# Patient Record
Sex: Male | Born: 1985 | Race: White | Hispanic: No | Marital: Married | Smoking: Never smoker
Health system: Southern US, Community
[De-identification: ages and names within clinical notes are randomized; demographics above are authoritative.]

## PROBLEM LIST (undated history)

## (undated) DIAGNOSIS — T7840XA Allergy, unspecified, initial encounter: Secondary | ICD-10-CM

## (undated) DIAGNOSIS — N200 Calculus of kidney: Secondary | ICD-10-CM

## (undated) DIAGNOSIS — L409 Psoriasis, unspecified: Secondary | ICD-10-CM

## (undated) DIAGNOSIS — Z87442 Personal history of urinary calculi: Secondary | ICD-10-CM

## (undated) DIAGNOSIS — K219 Gastro-esophageal reflux disease without esophagitis: Secondary | ICD-10-CM

## (undated) HISTORY — PX: WISDOM TOOTH EXTRACTION: SHX21

## (undated) HISTORY — PX: PILONIDAL CYST EXCISION: SHX744

## (undated) HISTORY — DX: Allergy, unspecified, initial encounter: T78.40XA

## (undated) HISTORY — DX: Psoriasis, unspecified: L40.9

## (undated) HISTORY — PX: LITHOTRIPSY: SUR834

## (undated) HISTORY — PX: OTHER SURGICAL HISTORY: SHX169

## (undated) HISTORY — PX: UPPER GI ENDOSCOPY: SHX6162

## (undated) HISTORY — DX: Calculus of kidney: N20.0

---
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
## 2006-03-21 HISTORY — PX: PILONIDAL CYST EXCISION: SHX744

## 2012-08-21 ENCOUNTER — Ambulatory Visit (INDEPENDENT_AMBULATORY_CARE_PROVIDER_SITE_OTHER): Payer: BC Managed Care – PPO | Admitting: Family Medicine

## 2012-08-21 ENCOUNTER — Encounter: Payer: Self-pay | Admitting: Family Medicine

## 2012-08-21 VITALS — BP 120/80 | Temp 97.8°F | Ht 68.25 in | Wt 160.0 lb

## 2012-08-21 DIAGNOSIS — L408 Other psoriasis: Secondary | ICD-10-CM

## 2012-08-21 DIAGNOSIS — Z7689 Persons encountering health services in other specified circumstances: Secondary | ICD-10-CM

## 2012-08-21 DIAGNOSIS — L409 Psoriasis, unspecified: Secondary | ICD-10-CM

## 2012-08-21 DIAGNOSIS — Z7189 Other specified counseling: Secondary | ICD-10-CM

## 2012-08-21 MED ORDER — TRIAMCINOLONE ACETONIDE 0.1 % EX CREA: TOPICAL_CREAM | Freq: Two times a day (BID) | CUTANEOUS | Status: DC

## 2012-08-21 NOTE — Patient Instructions (Signed)
-  We have ordered labs or studies at this visit. It can take up to 1-2 weeks for results and processing. We will contact you with instructions IF your results are abnormal. Normal results will be released to your MYCHART. If you have not heard from us or can not find your results in MYCHART in 2 weeks please contact our office.  -PLEASE SIGN UP FOR MYCHART TODAY   We recommend the following healthy lifestyle measures: - eat a healthy diet consisting of lots of vegetables, fruits, beans, nuts, seeds, healthy meats such as white chicken and fish and whole grains.  - avoid fried foods, fast food, processed foods, sodas, red meet and other fattening foods.  - get a least 150 minutes of aerobic exercise per week.   Follow up in: 1 year or as needed  

## 2012-08-21 NOTE — Progress Notes (Signed)
Chief Complaint  Patient presents with  . Establish Care    HPI:  George Peterson is here to establish care. Recenlty moved back to area. Last PCP and physical: had physical in 2011.  Has the following chronic problems and concerns today:  Wants refill on steroid cream for skin issues -told was psoriasis in the past -currently not bad at all - a little pruritis of scalp and elbows  There are no active problems to display for this patient.  Health Maintenance: -UTD on vaccine, thinks had tdap in last 5 years  ROS: See pertinent positives and negatives per HPI.  Past Medical History  Diagnosis Date  . Allergy   . Kidney stones     multiple stones passed as child, had lithotripsy once  . Psoriasis     Family History  Problem Relation Age of Onset  . Arthritis Father   . Hypertension Mother     History   Social History  . Marital Status: Married    Spouse Name: N/A    Number of Children: N/A  . Years of Education: N/A   Social History Main Topics  . Smoking status: Never Smoker   . Smokeless tobacco: None  . Alcohol Use: Yes     Comment: occ, 2-4 drinks a few times per month  . Drug Use: No  . Sexually Active: None   Other Topics Concern  . 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  GENERAL: vitals reviewed and listed above, alert, oriented, appears well hydrated and in no acute distress  HEENT: atraumatic, conjunttiva clear, no obvious abnormalities on inspection of external nose and ears  NECK: no obvious masses on  inspection  LUNGS: clear to auscultation bilaterally, no wheezes, rales or rhonchi, good air movement  CV: HRRR, no peripheral edema  MS: moves all extremities without noticeable abnormality  SKIN: very minor dry skin on elbows, few flakes scalp - also very minor  PSYCH: pleasant and cooperative, no obvious depression or anxiety  ASSESSMENT AND PLAN:  Discussed the following assessment and plan:  Psoriasis - Plan: triamcinolone cream (KENALOG) 0.1 %  Encounter to establish care  -We reviewed the PMH, PSH, FH, SH, Meds and Allergies. -We provided refills for any medications we will prescribe as needed. -We addressed current concerns per orders and patient instructions. -We have asked for records for pertinent exams, studies, vaccines and notes from previous providers. -We have advised patient to follow up per instructions below.   -Patient advised to return or notify a doctor immediately if symptoms worsen or persist or new concerns arise.  Patient Instructions  -We have ordered labs or studies at this visit. It can take up to 1-2 weeks for results and processing. We will contact you with instructions IF your results are abnormal. Normal results will be released to your [facility]. If you have not heard from [place] or can not find your results  in [facility] in 2 weeks please contact our office.  -PLEASE SIGN UP FOR MYCHART TODAY   We recommend the following healthy lifestyle measures: - eat a healthy diet consisting of lots of vegetables, fruits, beans, nuts, seeds, healthy meats such as white chicken and fish and whole grains.  - avoid fried foods, fast food, processed foods, sodas, red meet and other fattening foods.  - get a least 150 minutes of aerobic exercise per week.   Follow up in: 1 year or as needed      Nicodemus Denk, Dahlia Client R.

## 2012-11-29 ENCOUNTER — Encounter: Payer: Self-pay | Admitting: Family Medicine

## 2012-11-29 ENCOUNTER — Ambulatory Visit (INDEPENDENT_AMBULATORY_CARE_PROVIDER_SITE_OTHER): Payer: BC Managed Care – PPO | Admitting: Family Medicine

## 2012-11-29 VITALS — BP 140/98 | Temp 98.1°F | Wt 161.0 lb

## 2012-11-29 DIAGNOSIS — H6120 Impacted cerumen, unspecified ear: Secondary | ICD-10-CM

## 2012-11-29 DIAGNOSIS — H60392 Other infective otitis externa, left ear: Secondary | ICD-10-CM

## 2012-11-29 DIAGNOSIS — H60399 Other infective otitis externa, unspecified ear: Secondary | ICD-10-CM

## 2012-11-29 DIAGNOSIS — H6122 Impacted cerumen, left ear: Secondary | ICD-10-CM

## 2012-11-29 MED ORDER — CIPROFLOXACIN-DEXAMETHASONE 0.3-0.1 % OT SUSP: 4.0000 [drp] | Freq: Two times a day (BID) | OTIC | Status: DC

## 2012-11-29 NOTE — Patient Instructions (Addendum)
-  use medication as instructed  -nothing else in ears  -return if persists or worsens

## 2012-11-29 NOTE — Progress Notes (Signed)
Chief Complaint  Patient presents with  . Otalgia    left    HPI:  Acute visit for:  Ear Pain: -started about 4 days ago -on and of pain and pressure in this ear -tried ear wax drops, had some wax come out and ear cleaning kits and water in ears -denies: fevers, chills, malaise, other symptoms, drainage from ears, hearing loss  ROS: See pertinent positives and negatives per HPI.  Past Medical History  Diagnosis Date  . Allergy   . Kidney stones     multiple stones passed as child, had lithotripsy once  . Psoriasis     Past Surgical History  Procedure Laterality Date  . Lithotripsy    . Lymph node biospy      with splenic infection  . Wisdom tooth extraction    . Pilonidal cyst excision      Family History  Problem Relation Age of Onset  . Arthritis Father   . Hypertension Mother     History   Social History  . Marital Status: Married    Spouse Name: N/A    Number of Children: N/A  . Years of Education: N/A   Social History Main Topics  . Smoking status: Never Smoker   . Smokeless tobacco: None  . Alcohol Use: Yes     Comment: occ, 2-4 drinks a few times per month  . Drug Use: No  . Sexual Activity: None   Other Topics Concern  . 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 For 7 days, Disp: 7.5 mL, Rfl: 0  EXAM:  Filed Vitals:   11/29/12 1338  BP: 140/98  Temp: 98.1 F (36.7 C)    Body mass index is 24.29 kg/(m^2).  GENERAL: vitals reviewed and listed above, alert, oriented, appears well hydrated and in no acute  distress  HEENT: atraumatic, conjunttiva clear, no obvious abnormalities on inspection of external nose and ears, left ear canal with significant ear wax - removed some soft wax and mild erythema of canal with mild swelling, some wax remained on TM; clear nasal congestion, PND, no pain with tragus pull, no mastoid TTP  NECK: no obvious masses on inspection  LUNGS: clear to auscultation bilaterally, no wheezes, rales or rhonchi, good air movement  CV: HRRR, no peripheral edema  MS: moves all extremities without noticeable abnormality  PSYCH: pleasant and cooperative, no obvious depression or anxiety  ASSESSMENT AND PLAN:  Discussed the following assessment and plan:  Otitis, externa, infective, left - Plan: ciprofloxacin-dexamethasone (CIPRODEX) otic suspension  Cerumen impaction, left  -removed wax from distal canal with soft current pt tolerated well -will tx what looks like mild otitis externa with ciprodex -pt is to return if symptoms persist or worsen -Patient advised to return or notify a doctor immediately if symptoms worsen or persist or new concerns arise.  Patient Instructions  -use medication as instructed  -nothing else in ears  -return if persists or worsens     Allix Blomquist,  Tawni Melkonian R.

## 2017-10-03 DIAGNOSIS — B070 Plantar wart: Secondary | ICD-10-CM | POA: Diagnosis not present

## 2017-10-03 DIAGNOSIS — L409 Psoriasis, unspecified: Secondary | ICD-10-CM | POA: Diagnosis not present

## 2018-01-14 DIAGNOSIS — Z0100 Encounter for examination of eyes and vision without abnormal findings: Secondary | ICD-10-CM | POA: Diagnosis not present

## 2018-04-03 DIAGNOSIS — L301 Dyshidrosis [pompholyx]: Secondary | ICD-10-CM | POA: Diagnosis not present

## 2018-04-03 DIAGNOSIS — L400 Psoriasis vulgaris: Secondary | ICD-10-CM | POA: Diagnosis not present

## 2018-04-20 ENCOUNTER — Telehealth: Payer: Self-pay | Admitting: Family

## 2018-04-20 DIAGNOSIS — R0602 Shortness of breath: Secondary | ICD-10-CM

## 2018-04-20 NOTE — Progress Notes (Signed)
Based on what you shared with me it looks like you have a serious condition that should be evaluated in a face to face office visit.  NOTE: If you entered your credit card information for this eVisit, you will not be charged. You may see a "hold" on your card for the $30 but that hold will drop off and you will not have a charge processed.  If you are having a true medical emergency please call 911.  If you need an urgent face to face visit, [facility] has four urgent care centers for your convenience.  If you need care fast and have a high deductible or no insurance consider:   [email or web address] to reserve your spot online an avoid wait times  [facility] [address], [address] [place], [place] [address] 8 am to 8 pm Monday-Friday 10 am to 4 pm Saturday-Sunday *Across the street from [employer]  [facility]  [address]  [place], [address] 8 am to 5 pm Monday-Friday * In the [facility]   The following sites will take your  insurance:  . [facility]  [phone] Get Driving Directions Find a Provider at this Location  [address] [place], [place] [address] . 10 am to 8 pm Monday-Friday . 12 pm to 8 pm Saturday-Sunday   . [facility]  [phone] Get Driving Directions Find a Provider at this Location  [address], [address] [place], [place] [address] . 8 am to 8 pm Monday-Friday . 9 am to 6 pm Saturday . 11 am to 6 pm Sunday   . [facility] [place]  [phone] Get Driving Directions  [address] [place],  [address] . 8 am to 8 pm Monday-Friday . 8 am to 4 pm Saturday-Sunday   Your e-visit answers were reviewed by a board certified advanced clinical practitioner to complete your personal care plan.  Thank you for using e-Visits.  

## 2018-04-23 ENCOUNTER — Ambulatory Visit: Payer: 59 | Admitting: Internal Medicine

## 2018-04-23 ENCOUNTER — Encounter: Payer: Self-pay | Admitting: Internal Medicine

## 2018-04-23 VITALS — BP 112/68 | HR 76 | Temp 98.1°F | Wt 157.9 lb

## 2018-04-23 DIAGNOSIS — J029 Acute pharyngitis, unspecified: Secondary | ICD-10-CM | POA: Diagnosis not present

## 2018-04-23 DIAGNOSIS — J069 Acute upper respiratory infection, unspecified: Secondary | ICD-10-CM

## 2018-04-23 NOTE — Patient Instructions (Signed)
Your exam is reassuring   And throat symptoms .     Are consistent with recovering  Viral flu like illness   Hot liquids  Ibuprofen   For comfort   Fu if  Relapsing sx .   High fevers.     Localized pain not going away. Of  Other concerns . May take another week to be totally better .

## 2018-04-23 NOTE — Progress Notes (Signed)
Chief Complaint  Patient presents with  . flu like symptoms    x1 week had a cough and runny nose. Friday  morning had low grade fever has had sore throat for about 3 days. Non productive cough. No chills or body aches.    HPI: George Peterson 33 y.o. come in for onset last tues  Jan 28 cold like and then fever for one day 99.9   Jan 31 and runny nose   Over weekend   Throat felt swollen right side    Wants throat  checked  Wife not sick but  Team at work last week had flu.  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  Past Medical History:  Diagnosis Date  . Allergy   . Kidney stones    multiple stones passed as child, had lithotripsy once  . Psoriasis     Family History  Problem Relation Age of Onset  . Arthritis Father   . Hypertension Mother     Social History   Socioeconomic History  . Marital status: Married    Spouse name: Not on file  . Number of children: Not on file  . Years of education: Not on file  . Highest education level: Not on file  Occupational History  . Not on file  Social Needs  . Financial resource strain: Not on file  . Food insecurity:    Worry: Not on file    Inability: Not on file  . Transportation needs:    Medical: Not on file    Non-medical: Not on file  Tobacco Use  . Smoking status: Never Smoker  . Smokeless tobacco: Never Used  Substance and Sexual Activity  . Alcohol use: Yes    Comment: occ, 2-4 drinks a few times per month  . Drug use: No  . Sexual activity: Not on file  Lifestyle  . Physical activity:    Days per week: Not on file    Minutes per session: Not on file  . Stress: Not on file  Relationships  . Social connections:    Talks on phone: Not on file    Gets together: Not on file    Attends religious service: Not on file    Active member of club or organization: Not on file    Attends meetings of clubs or organizations: Not on file   Relationship status: Not on file  Other Topics Concern  . 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 ciprofloxacin-dexamethasone (CIPRODEX) otic suspension Place 4 drops into the left ear 2 (two) times daily. For 7 days 7.5 mL 0  . triamcinolone cream (KENALOG) 0.1 % Apply topically 2 (two) times daily. 30 g 1   No facility-administered medications prior to visit.      EXAM:  BP 112/68 (BP Location: Right Arm, Patient Position: Sitting, Cuff Size: Normal)   Pulse 76   Temp 98.1 F (36.7 C) (Oral)   Wt 157 lb 14.4 oz (71.6 kg)   SpO2 98%   BMI  23.83 kg/m   Body mass index is 23.83 kg/m.  GENERAL: vitals reviewed and listed above, alert, oriented, appears well hydrated and in no acute distress mild congestion HEENT: atraumatic, conjunctiva  clear, no obvious abnormalities on inspection of external nose and ears tms clear OP : no lesion edema or exudate   Cobblestoning   Right more than left  Face not tender   NECK: no obvious masses on inspection palpation  Feels tender righ ac shaningbut no adenopathy  LUNGS: clear to auscultation bilaterally, no wheezes, rales or rhonchi, good air movement CV: HRRR, no clubbing cyanosis or  peripheral edema nl cap refill  MS: moves all extremities without noticeable focal  abnormality PSYCH: pleasant and cooperative, no obvious depression or anxiety No results found for: WBC, HGB, HCT, PLT, GLUCOSE, CHOL, TRIG, HDL, LDLDIRECT, LDLCALC, ALT, AST, NA, K, CL, CREATININE, BUN, CO2, TSH, PSA, INR, GLUF, HGBA1C, MICROALBUR BP Readings from Last 3 Encounters:  04/23/18 112/68  11/29/12 (!) 140/98  08/21/12 120/80    ASSESSMENT AND PLAN:  Discussed the following assessment and  plan:  Sore throat - viral cause  no obv bacterial infection  disc alarm sx and what to fu or alarm sx ex localizing sx and adenopathy   Acute upper respiratory infection of multiple sites flu like   -Patient advised to return or notify health care team  if  new concerns arise.  Patient Instructions  Your exam is reassuring   And throat symptoms .     Are consistent with recovering  Viral flu like illness   Hot liquids  Ibuprofen   For comfort   Fu if  Relapsing sx .   High fevers.     Localized pain not going away. Of  Other concerns . May take another week to be totally better .        Neta Mends. Jackey Housey M.D.

## 2018-09-07 ENCOUNTER — Ambulatory Visit (INDEPENDENT_AMBULATORY_CARE_PROVIDER_SITE_OTHER): Payer: 59 | Admitting: Family Medicine

## 2018-09-07 ENCOUNTER — Other Ambulatory Visit: Payer: Self-pay

## 2018-09-07 ENCOUNTER — Encounter: Payer: Self-pay | Admitting: Family Medicine

## 2018-09-07 DIAGNOSIS — L409 Psoriasis, unspecified: Secondary | ICD-10-CM | POA: Insufficient documentation

## 2018-09-07 DIAGNOSIS — Z20828 Contact with and (suspected) exposure to other viral communicable diseases: Secondary | ICD-10-CM

## 2018-09-07 DIAGNOSIS — Z20822 Contact with and (suspected) exposure to covid-19: Secondary | ICD-10-CM

## 2018-09-07 DIAGNOSIS — N200 Calculus of kidney: Secondary | ICD-10-CM

## 2018-09-07 NOTE — Progress Notes (Signed)
Virtual Visit via Video Note  I connected with. Name  on 09/07/18 at  9:30 AM EDT by a video enabled telemedicine application and verified that I am speaking with the correct person using two identifiers.  Location patient: home Location provider:work or home office Persons participating in the virtual visit: patient, provider  I discussed the limitations of evaluation and management by telemedicine and the availability of in person appointments. The patient expressed understanding and agreed to proceed.   George Peterson DOB: [birth year] Encounter date: 09/07/2018  This is a [age] y.o. male who presents with No chief complaint on file.   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 He states that building is open building with 5 foot cubed walls. There are over 400 people in building and right now just over 100 people in there. His biggest concern is that he is only one wearing mask at this point. Works for [employer].   Started working from home back in march.   Sees dermatology for psoriasis; occasionally gets steroid injections for flares when needed. Not on immunosuppressive therapy at this point. Generally well controlled between topical and the injectable steroids.   Has kidney stones about yearly. Usually able to pass on own. Has been awhile since he has seen someone for these. First episode when he was 33 yrs old. Oxylate based. Stays hydrated. Just manages on own. Hasn't seen someone (specialist) in about 10 years.   Hasn't had recent bloodwork.   No Known Allergies No outpatient medications have been marked as taking for the 09/07/18 encounter (Office Visit) with Wynn BankerKoberlein, Junell C, MD.    Review of Systems  Constitutional: Negative for chills, fatigue and fever.  Respiratory: Negative for cough, chest tightness, shortness of breath and wheezing.    Cardiovascular: Negative for chest pain, palpitations and leg swelling.  Skin:       Intermittent psoriasis flares; manages with topical steroid and occasionally steroid injection.    Objective:  There were no vitals taken for this visit.      BP Readings from Last 3 Encounters:  04/23/18 112/68  11/29/12 (!) 140/98  08/21/12 120/80   Wt Readings from Last 3 Encounters:  04/23/18 157 lb 14.4 oz (71.6 kg)  11/29/12 161 lb (73 kg)  08/21/12 160 lb (72.6 kg)    EXAM:  GENERAL: alert, oriented, appears well and in no acute distress  HEENT: atraumatic, conjunctiva clear, no obvious abnormalities on inspection of external nose and ears  NECK: normal movements of the head and neck  LUNGS: on inspection no signs of respiratory distress, breathing rate appears normal, no obvious gross SOB, gasping or wheezing  CV: no obvious cyanosis  MS: moves all visible extremities without noticeable abnormality  PSYCH/NEURO: pleasant and cooperative, no obvious depression or anxiety, speech and thought processing grossly intact   Assessment/Plan  1. Psoriasis Stable; follows with derm. Steroid cream/injection intermittently. Not on immunosuppressives due to limited flares.   2. Nephrolithiasis Recurrent. Self limited and works on dietary measures to avoid.   3. Exposure to Covid-19 Virus Most concerning to patient is wife's health. She has had multiple health issues in recent years. Has had issues with breathing/recurrent severe costochondritis and has undergone evaluation without solid diagnosis. Would like to limit her exposure and he worries that without colleagues masking at work and being in large building without rooms that exposure potential is  greater. He is masking and washing hands; taking precautions that he can. I have written work note for him to suggest that working remotely would be preferred. Even if we limited amount of time in the office and did more remote work; this  would be helpful from exposure standpoint.    Return has physical scheduled in August; will get bloodwork at that time..  Reviewed problem list, med history with him today.  I discussed the assessment and treatment plan with the patient. The patient was provided an opportunity to ask questions and all were answered. The patient agreed with the plan and demonstrated an understanding of the instructions.   The patient was advised to call back or seek an in-person evaluation if the symptoms worsen or if the condition fails to improve as anticipated.  I provided 22 minutes of non-face-to-face time during this encounter.   Micheline Rough, MD

## 2018-09-11 ENCOUNTER — Ambulatory Visit (INDEPENDENT_AMBULATORY_CARE_PROVIDER_SITE_OTHER): Payer: 59 | Admitting: Family Medicine

## 2018-09-11 ENCOUNTER — Ambulatory Visit: Payer: Self-pay | Admitting: *Deleted

## 2018-09-11 ENCOUNTER — Other Ambulatory Visit: Payer: Self-pay

## 2018-09-11 DIAGNOSIS — Z20828 Contact with and (suspected) exposure to other viral communicable diseases: Secondary | ICD-10-CM | POA: Diagnosis not present

## 2018-09-11 DIAGNOSIS — Z20822 Contact with and (suspected) exposure to covid-19: Secondary | ICD-10-CM

## 2018-09-11 NOTE — Telephone Encounter (Signed)
Pt called stating that he was exposed to a co-worker who tested positive for COVID; the affected person's last day at work was 09/06/2018; the pt says that he also needs to have a negative test result in order to accompany his wife to MD appointments; recommendations made per nurse triage protocol;he verbalized understanding; the pt normally sees Dr Presley Raddle, [facility]; pt transferred to [facility] for scheduling.  Reason for Disposition . [1] COVID-19 EXPOSURE (Close Contact) AND [2] within last 14 days BUT [3] NO symptoms  Answer Assessment - Initial Assessment Questions 1. CLOSE CONTACT: "Who is the person with the confirmed or suspected COVID-19 infection that you were exposed to?"   Co-worker 2. PLACE of CONTACT: "Where were you when you were exposed to COVID-19?" (e.g., home, school, medical waiting room; which city?)     work 3. TYPE of CONTACT: "How much contact was there?" (e.g., sitting next to, live in same house, work in same office, same building)     Same building 4. DURATION of CONTACT: "How long were you in contact with the COVID-19 patient?" (e.g., a few seconds, passed by person, a few minutes, live with the patient)     hours 5. DATE of CONTACT: "When did you have contact with a COVID-19 patient?" (e.g., how many days ago)    09/06/2018 6. TRAVEL: "Have you traveled out of the country recently?" If so, "When and where?"     * Also ask about out-of-state travel, since the CDC has identified some high-risk cities for community spread in the [place].     * Note: Travel becomes less relevant if there is widespread community transmission where the patient lives.     no 7. COMMUNITY SPREAD: "Are there lots of cases of COVID-19 (community spread) where you live?" (See public health department website, if unsure)       Major community spread 8. SYMPTOMS: "Do you have any symptoms?" (e.g., fever, cough, breathing difficulty)    no 9. PREGNANCY OR POSTPARTUM: "Is there any chance  you are pregnant?" "When was your last menstrual period?" "Did you deliver in the last 2 weeks?"     n/a 10. HIGH RISK: "Do you have any heart or lung problems? Do you have a weak immune system?" (e.g., CHF, COPD, asthma, HIV positive, chemotherapy, renal failure, diabetes mellitus, sickle cell anemia)      Psoriasis (last dose of kenalog Jan 2020); currently treats with topical agents  Protocols used: CORONAVIRUS (COVID-19) EXPOSURE-A-AH

## 2018-09-11 NOTE — Patient Instructions (Addendum)
I have asked my assistant to order Coronavirus (COVID19) testing for you. Please call our office if you have any concerns or questions or this testing has not been arranged in the next 24-48 hours.   If you develop symptoms please follow up with [place].  Wear a mask at all times if you are out and follow work recommendations from your health at work or [place]. The CDC is a good resource as well.

## 2018-09-11 NOTE — Progress Notes (Signed)
Virtual Visit via Video Note  I connected with George Peterson  on 09/11/18 at  4:20 PM EDT by a video enabled telemedicine application and verified that I am speaking with the correct person using two identifiers.  Location patient: home Location provider:work or home office Persons participating in the virtual visit: patient, provider  I discussed the limitations of evaluation and management by telemedicine and the availability of in person appointments. The patient expressed understanding and agreed to proceed.   HPI:  Possible COVID19 exposure: -someone in his building at work tested positive for COVID19 -he is not sure of who had it so he does not know if he was in any contact, but they did tell him that it was someone who works on a different floor so he does not believe he was in any contact with them -he does wear a mask at work -his wife needs him to get tested so that he she can see her obgyn -he is not having any symptoms -denies fevers, cough, sore throat , body aches, SOB -he does have a little runny nose and sneezing but that is normal or him with allergies  ROS: See pertinent positives and negatives per HPI.  Past Medical History:  Diagnosis Date  . Allergy   . Kidney stones    multiple stones passed as child, had lithotripsy once  . Psoriasis     Past Surgical History:  Procedure Laterality Date  . LITHOTRIPSY    . lymph node biospy     with splenic infection  . PILONIDAL CYST EXCISION  2008  . WISDOM TOOTH EXTRACTION      Family History  Problem Relation Age of Onset  . Arthritis Father   . Hypertension Mother     SOCIAL HX: see hpi   Current Outpatient Medications:  .  ciprofloxacin-dexamethasone (CIPRODEX) otic suspension, Place 4 drops into the left ear 2 (two) times daily. For 7 days, Disp: 7.5 mL, Rfl: 0 .  triamcinolone cream (KENALOG) 0.1 %, Apply topically 2 (two) times daily., Disp: 30 g, Rfl: 1  EXAM:  VITALS per patient if  applicable:  GENERAL: alert, oriented, appears well and in no acute distress  HEENT: atraumatic, conjunttiva clear, no obvious abnormalities on inspection of external nose and ears  NECK: normal movements of the head and neck  LUNGS: on inspection no signs of respiratory distress, breathing rate appears normal, no obvious gross SOB, gasping or wheezing  CV: no obvious cyanosis  MS: moves all visible extremities without noticeable abnormality  PSYCH/NEURO: pleasant and cooperative, no obvious depression or anxiety, speech and thought processing grossly intact  ASSESSMENT AND PLAN:  Discussed the following assessment and plan:  Exposure to Covid-19 Virus -  Because of the possible exposure, unknown contact did advise testing. His wife's ob/gyn office is  Requiring that he get tested before they will see her. Advised assistant to order. Follow up if any symptoms develop. Use mask, social distancing, good hand hygiene. Follow up as needed if positive or if symptoms develop or there are further questions.     I discussed the assessment and treatment plan with the patient. The patient was provided an opportunity to ask questions and all were answered. The patient agreed with the plan and demonstrated an understanding of the instructions.   The patient was advised to call back or seek an in-person evaluation if the symptoms worsen or if the condition fails to improve as anticipated.   Follow up instructions: Advised assistant Ronnald CollumJo Anne  to help patient arrange the following: -please order COVID19 testing  Lucretia Kern, DO

## 2018-09-11 NOTE — Telephone Encounter (Signed)
Pt has never been seen in this office, not anywhere in [facility] per chart. Pt will need NP visit to establish care before any testing can be ordered.

## 2018-09-12 ENCOUNTER — Telehealth: Payer: Self-pay | Admitting: *Deleted

## 2018-09-12 NOTE — Telephone Encounter (Signed)
-----   Message from Lucretia Kern, DO sent at 09/11/2018  4:40 PM EDT ----- -please order COVID19 testing

## 2018-09-12 NOTE — Telephone Encounter (Signed)
Community message sent to the [facility] for COVID test.  I called the pt and informed him someone will call with appt info.

## 2018-09-12 NOTE — Telephone Encounter (Signed)
Pt is calling and he was able to get covid 19 test at [facility]

## 2018-09-12 NOTE — Telephone Encounter (Signed)
Called pt, lvm to return call to schedule covid testing.  ° ° °

## 2018-09-13 NOTE — Telephone Encounter (Signed)
Spoke with patient and he has already had the test for COVID and appointment is not needed at this time.

## 2018-09-13 NOTE — Telephone Encounter (Signed)
Would E[facility] see pt for NP as virtual? Thanks!

## 2018-11-09 ENCOUNTER — Encounter: Payer: Self-pay | Admitting: Family Medicine

## 2018-11-09 ENCOUNTER — Ambulatory Visit: Payer: 59 | Admitting: Family Medicine

## 2018-11-09 ENCOUNTER — Other Ambulatory Visit: Payer: Self-pay

## 2018-11-09 VITALS — BP 110/80 | HR 97 | Temp 97.7°F | Ht 68.0 in | Wt 153.8 lb

## 2018-11-09 DIAGNOSIS — H6121 Impacted cerumen, right ear: Secondary | ICD-10-CM | POA: Diagnosis not present

## 2018-11-09 DIAGNOSIS — Z13220 Encounter for screening for lipoid disorders: Secondary | ICD-10-CM | POA: Diagnosis not present

## 2018-11-09 DIAGNOSIS — N200 Calculus of kidney: Secondary | ICD-10-CM

## 2018-11-09 DIAGNOSIS — Z0000 Encounter for general adult medical examination without abnormal findings: Secondary | ICD-10-CM | POA: Diagnosis not present

## 2018-11-09 DIAGNOSIS — Z23 Encounter for immunization: Secondary | ICD-10-CM | POA: Diagnosis not present

## 2018-11-09 DIAGNOSIS — R221 Localized swelling, mass and lump, neck: Secondary | ICD-10-CM | POA: Diagnosis not present

## 2018-11-09 LAB — CBC WITH DIFFERENTIAL/PLATELET
Basophils Absolute: 0.1 10*3/uL (ref 0.0–0.1)
Basophils Relative: 0.9 % (ref 0.0–3.0)
Eosinophils Absolute: 0.2 10*3/uL (ref 0.0–0.7)
Eosinophils Relative: 3.3 % (ref 0.0–5.0)
HCT: 41.8 % (ref 39.0–52.0)
Hemoglobin: 15 g/dL (ref 13.0–17.0)
Lymphocytes Relative: 28.7 % (ref 12.0–46.0)
Lymphs Abs: 1.9 10*3/uL (ref 0.7–4.0)
MCHC: 35.9 g/dL (ref 30.0–36.0)
MCV: 84.8 fl (ref 78.0–100.0)
Monocytes Absolute: 0.4 10*3/uL (ref 0.1–1.0)
Monocytes Relative: 6.4 % (ref 3.0–12.0)
Neutro Abs: 4.1 10*3/uL (ref 1.4–7.7)
Neutrophils Relative %: 60.7 % (ref 43.0–77.0)
Platelets: 177 10*3/uL (ref 150.0–400.0)
RBC: 4.93 Mil/uL (ref 4.22–5.81)
RDW: 12.2 % (ref 11.5–15.5)
WBC: 6.7 10*3/uL (ref 4.0–10.5)

## 2018-11-09 LAB — COMPREHENSIVE METABOLIC PANEL
ALT: 29 U/L (ref 0–53)
AST: 24 U/L (ref 0–37)
Albumin: 5.2 g/dL (ref 3.5–5.2)
Alkaline Phosphatase: 52 U/L (ref 39–117)
BUN: 8 mg/dL (ref 6–23)
CO2: 29 mEq/L (ref 19–32)
Calcium: 10 mg/dL (ref 8.4–10.5)
Chloride: 103 mEq/L (ref 96–112)
Creatinine, Ser: 0.91 mg/dL (ref 0.40–1.50)
GFR: 96.2 mL/min (ref >60.00)
Glucose, Bld: 90 mg/dL (ref 70–99)
Potassium: 4.2 mEq/L (ref 3.5–5.1)
Sodium: 140 mEq/L (ref 135–145)
Total Bilirubin: 0.8 mg/dL (ref 0.2–1.2)
Total Protein: 7.5 g/dL (ref 6.0–8.3)

## 2018-11-09 LAB — LIPID PANEL
Cholesterol: 144 mg/dL (ref 0–200)
HDL: 44.6 mg/dL (ref >39.00)
LDL Cholesterol: 85 mg/dL (ref 0–99)
NonHDL: 99.53
Total CHOL/HDL Ratio: 3
Triglycerides: 71 mg/dL (ref 0.0–149.0)
VLDL: 14.2 mg/dL (ref 0.0–40.0)

## 2018-11-09 LAB — TSH: TSH: 1.67 u[IU]/mL (ref 0.35–4.50)

## 2018-11-09 NOTE — Progress Notes (Signed)
George Peterson DOB: Aug 29, 1985 Encounter date: 11/09/2018  This is a 33 y.o. male who presents for complete physical   History of present illness/Additional concerns: -work was unwilling to let him work from home initially in order to protect wife; but then with orders coming out for masking - now everyone in building is required to wear a mask. Doesn't think there have been additional COVID cases.  No new health concerns.   Sometimes on right side of neck feels lump when he swallows. Cannot palpate anything there. 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 Not affecting his swallowing. But sometimes gets sensation of something being there/stuck. Has been going on for years. Just examine with palpation. Has been looked at by dentist. Comes and goes. Right now it is there. Cant tell exactly where it is in pathway of swallowing. Not worse since started. Hasn't associated anything with allergies, foods, reflux.   Had cholesterol screening done last year for work.   Eating healthy, exercising at home regularly - yoga, weights.   Is following with dermatology ([facility]) for psoriasis, but this has been pretty well controlled.   Past Medical History:  Diagnosis Date  . Allergy   . Kidney stones    multiple stones passed as child, had lithotripsy once  . Psoriasis    Past Surgical History:  Procedure Laterality Date  . LITHOTRIPSY    . lymph node biospy     with splenic infection  . PILONIDAL CYST EXCISION  2008  . WISDOM TOOTH EXTRACTION     No Known Allergies Current Meds  Medication Sig  . triamcinolone cream (KENALOG) 0.1 % Apply topically 2 (two) times daily.   Social History   Tobacco Use  . Smoking status: Never Smoker  . Smokeless tobacco: Never Used  Substance Use Topics  . Alcohol use: Yes    Comment: occ, 2-4 drinks a few times per month   Family History  Problem Relation Age of Onset  . Arthritis Father   . Hypertension Mother       Review of Systems  Constitutional: Negative for activity change, appetite change, chills, fatigue, fever and unexpected weight change.  HENT: Negative for congestion, ear pain, hearing loss, sinus pressure, sinus pain, sore throat and trouble swallowing.   Eyes: Negative for pain and visual disturbance.  Respiratory: Negative for cough, chest tightness, shortness of breath and wheezing.   Cardiovascular: Negative for chest pain, palpitations and leg swelling.  Gastrointestinal: Negative for abdominal distention, abdominal pain, blood in stool, constipation, diarrhea, nausea and vomiting.  Genitourinary: Negative for decreased urine volume, difficulty urinating, dysuria, penile pain and testicular pain.  Musculoskeletal: Negative for arthralgias, back pain and joint swelling.  Skin: Negative for rash.  Neurological: Negative for dizziness, weakness, numbness and headaches.  Hematological: Negative for adenopathy. Does not bruise/bleed easily.  Psychiatric/Behavioral: Negative for agitation, sleep disturbance and suicidal ideas. The patient is not nervous/anxious.     CBC: No results found for: WBC, HGB, HCT, MCH, MCHC, RDW, PLT, MPV CMP:No results found for: NA, K, CL, CO2, ANIONGAP, GLUCOSE, BUN, CREATININE, LABGLOB, GFRAA, CALCIUM, PROT, AGRATIO, BILITOT, ALKPHOS, ALT, AST, GLOB LIPID:No results found for: CHOL, TRIG, HDL, LDLCALC, LABVLDL  Objective:  BP 110/80 (BP Location: Right Arm, Patient Position: Sitting, Cuff Size: Normal)   Pulse 97   Temp 97.7 F (36.5 C) (Temporal)   Ht 5\' 8"  (1.727 m)   Wt 153 lb 12.8 oz (69.8 kg)   SpO2 97%  BMI 23.39 kg/m   Weight: 153 lb 12.8 oz (69.8 kg)   BP Readings from Last 3 Encounters:  11/09/18 110/80  04/23/18 112/68  11/29/12 (!) 140/98   Wt Readings from Last 3 Encounters:  11/09/18 153 lb 12.8 oz (69.8 kg)  04/23/18 157 lb 14.4 oz (71.6 kg)  11/29/12 161 lb (73 kg)    Physical Exam Constitutional:      General: He is  not in acute distress.    Appearance: He is well-developed.  HENT:     Head: Normocephalic and atraumatic.     Comments: Tolerated irrigation well. There was some bleeding in right ear canal from adhered cerumen. This resolved with pressure. No pain per patient. I did insert cotton ball with antibiotic ointment to right ear to wear for day.     Right Ear: External ear normal.     Left Ear: External ear normal.     Ears:     Comments: Right TM obstructed by cerumen.    Nose: Nose normal.     Mouth/Throat:     Pharynx: No oropharyngeal exudate.  Eyes:     Conjunctiva/sclera: Conjunctivae normal.     Pupils: Pupils are equal, round, and reactive to light.  Neck:     Musculoskeletal: Neck supple.     Thyroid: No thyromegaly.      Comments: There is superficial sensation of fullness (similar to cyst on palpation but not discrete borders) that will palpation does reproduce some of fullness sensation patient describes (although sx are subtle). Approximate 1.5cm area of fullness. No visible change, no skin color change, no movement with swallowing. Does not feel lymph node or thyroid associated. Very subtle on exam.  Cardiovascular:     Rate and Rhythm: Normal rate and regular rhythm.     Heart sounds: Normal heart sounds. No murmur. No friction rub. No gallop.   Pulmonary:     Effort: Pulmonary effort is normal. No respiratory distress.     Breath sounds: Normal breath sounds. No stridor. No wheezing or rales.  Abdominal:     General: Bowel sounds are normal.     Palpations: Abdomen is soft.     Hernia: There is no hernia in the left inguinal area or right inguinal area.  Genitourinary:    Scrotum/Testes: Normal.        Right: Mass, tenderness or swelling not present. Right testis is descended.        Left: Mass, tenderness or swelling not present. Left testis is descended.     Epididymis:     Right: Normal.     Left: Normal.  Musculoskeletal: Normal range of motion.  Lymphadenopathy:      Cervical: No cervical adenopathy.     Lower Body: No right inguinal adenopathy. No left inguinal adenopathy.  Skin:    General: Skin is warm and dry.  Neurological:     Mental Status: He is alert and oriented to person, place, and time.  Psychiatric:        Behavior: Behavior normal.        Thought Content: Thought content normal.        Judgment: Judgment normal.     Assessment/Plan: Health Maintenance Due  Topic Date Due  . HIV Screening  04/10/2001  . TETANUS/TDAP  04/10/2005  . INFLUENZA VACCINE  10/20/2018   Health Maintenance reviewed - Tdap given today.  1. Preventative health care Keep up with healthy eating and regular exercise.   2. Nephrolithiasis Controlled with  hydration.  - Comprehensive metabolic panel; Future  3. Sensation of lump in throat Very ill defined superficial edema noted on right side neck - will get US for further evaluation.  - CBC with Differential/Platelet; Future - TSH; Future - US Soft Tissue Head/Neck; Future  4. Lipid screening - Lipid panel; Future  5. Need for Tdap vaccination - Tdap vaccine greater than or equal to 7yo IM  6. Hearing loss due to cerumen impaction, right Relieved with irrigation in office today. Hearing improved post large cerumen impaction removal. Encouraged to call with any concerns after procedure. NO qtips or flushing of ear for a week in order to allow ear canal irritation to heal.  Return in about 1 year (around 11/09/2019) for physical exam.  Theodis ShoveJunell , MD

## 2018-11-09 NOTE — Patient Instructions (Signed)
It was nice to see you today! I will call with bloodwork results once I get them. You will get a call about scheduling ultrasound of neck in next couple of weeks. We will touch base with you again once we see those results.

## 2018-11-21 ENCOUNTER — Other Ambulatory Visit: Payer: Self-pay | Admitting: Emergency Medicine

## 2018-11-21 DIAGNOSIS — Z20822 Contact with and (suspected) exposure to covid-19: Secondary | ICD-10-CM

## 2018-11-22 LAB — NOVEL CORONAVIRUS, NAA: SARS-CoV-2, NAA: NOT DETECTED

## 2018-12-06 ENCOUNTER — Other Ambulatory Visit: Payer: Self-pay | Admitting: Cardiology

## 2018-12-06 ENCOUNTER — Other Ambulatory Visit: Payer: 59

## 2018-12-06 DIAGNOSIS — Z20822 Contact with and (suspected) exposure to covid-19: Secondary | ICD-10-CM

## 2018-12-08 LAB — NOVEL CORONAVIRUS, NAA: SARS-CoV-2, NAA: NOT DETECTED

## 2018-12-13 ENCOUNTER — Ambulatory Visit
Admission: RE | Admit: 2018-12-13 | Discharge: 2018-12-13 | Disposition: A | Discharge status: Home / Self Care | Payer: 59 | Source: Ambulatory Visit | Attending: Family Medicine | Admitting: Family Medicine

## 2018-12-13 DIAGNOSIS — R221 Localized swelling, mass and lump, neck: Secondary | ICD-10-CM

## 2018-12-19 ENCOUNTER — Other Ambulatory Visit: Payer: 59

## 2019-04-30 ENCOUNTER — Other Ambulatory Visit: Payer: Self-pay

## 2019-04-30 ENCOUNTER — Emergency Department (HOSPITAL_COMMUNITY): Payer: 59

## 2019-04-30 ENCOUNTER — Encounter (HOSPITAL_COMMUNITY): Payer: Self-pay | Admitting: Emergency Medicine

## 2019-04-30 ENCOUNTER — Emergency Department (HOSPITAL_COMMUNITY)
Admission: EM | Admit: 2019-04-30 | Discharge: 2019-04-30 | Disposition: A | Discharge status: Home / Self Care | Payer: 59 | Attending: Emergency Medicine | Admitting: Emergency Medicine

## 2019-04-30 DIAGNOSIS — R0602 Shortness of breath: Secondary | ICD-10-CM | POA: Diagnosis not present

## 2019-04-30 DIAGNOSIS — Z79899 Other long term (current) drug therapy: Secondary | ICD-10-CM | POA: Diagnosis not present

## 2019-04-30 DIAGNOSIS — Z20822 Contact with and (suspected) exposure to covid-19: Secondary | ICD-10-CM | POA: Insufficient documentation

## 2019-04-30 DIAGNOSIS — R0689 Other abnormalities of breathing: Secondary | ICD-10-CM

## 2019-04-30 LAB — COMPREHENSIVE METABOLIC PANEL
ALT: 22 U/L (ref 0–44)
AST: 18 U/L (ref 15–41)
Albumin: 4.8 g/dL (ref 3.5–5.0)
Alkaline Phosphatase: 47 U/L (ref 38–126)
Anion gap: 7 (ref 5–15)
BUN: 11 mg/dL (ref 6–20)
CO2: 30 mmol/L (ref 22–32)
Calcium: 9.6 mg/dL (ref 8.9–10.3)
Chloride: 103 mmol/L (ref 98–111)
Creatinine, Ser: 0.76 mg/dL (ref 0.61–1.24)
GFR calc Af Amer: >60 mL/min (ref >60)
GFR calc non Af Amer: >60 mL/min (ref >60)
Glucose, Bld: 98 mg/dL (ref 70–99)
Potassium: 3.8 mmol/L (ref 3.5–5.1)
Sodium: 140 mmol/L (ref 135–145)
Total Bilirubin: 1.2 mg/dL (ref 0.3–1.2)
Total Protein: 7.9 g/dL (ref 6.5–8.1)

## 2019-04-30 LAB — CBC WITH DIFFERENTIAL/PLATELET
Abs Immature Granulocytes: 0.02 10*3/uL (ref 0.00–0.07)
Basophils Absolute: 0.1 10*3/uL (ref 0.0–0.1)
Basophils Relative: 1 %
Eosinophils Absolute: 0.1 10*3/uL (ref 0.0–0.5)
Eosinophils Relative: 2 %
HCT: 44.8 % (ref 39.0–52.0)
Hemoglobin: 15.6 g/dL (ref 13.0–17.0)
Immature Granulocytes: 0 %
Lymphocytes Relative: 18 %
Lymphs Abs: 1.5 10*3/uL (ref 0.7–4.0)
MCH: 30.4 pg (ref 26.0–34.0)
MCHC: 34.8 g/dL (ref 30.0–36.0)
MCV: 87.3 fL (ref 80.0–100.0)
Monocytes Absolute: 0.6 10*3/uL (ref 0.1–1.0)
Monocytes Relative: 8 %
Neutro Abs: 5.9 10*3/uL (ref 1.7–7.7)
Neutrophils Relative %: 71 %
Platelets: 188 10*3/uL (ref 150–400)
RBC: 5.13 MIL/uL (ref 4.22–5.81)
RDW: 12.6 % (ref 11.5–15.5)
WBC: 8.2 10*3/uL (ref 4.0–10.5)
nRBC: 0 % (ref 0.0–0.2)

## 2019-04-30 LAB — TROPONIN I (HIGH SENSITIVITY)
Troponin I (High Sensitivity): <2 ng/L (ref <18)
Troponin I (High Sensitivity): <2 ng/L (ref <18)

## 2019-04-30 LAB — RESPIRATORY PANEL BY RT PCR (FLU A&B, COVID)
Influenza A by PCR: NEGATIVE
Influenza B by PCR: NEGATIVE
SARS Coronavirus 2 by RT PCR: NEGATIVE

## 2019-04-30 LAB — LIPASE, BLOOD: Lipase: 22 U/L (ref 11–51)

## 2019-04-30 LAB — D-DIMER, QUANTITATIVE: D-Dimer, Quant: 0.27 ug/mL-FEU (ref 0.00–0.50)

## 2019-04-30 NOTE — ED Notes (Addendum)
Patient maintaining at 100% o2 saturation on room air while ambulating.

## 2019-04-30 NOTE — ED Provider Notes (Signed)
[facility] DEPT Provider Note   CSN: [id number] Arrival date & time: 04/30/19  0830     History Chief Complaint  Patient presents with  . Shortness of Breath    George Peterson is a 34 y.o. male.  The history is provided by the patient and medical records. No language interpreter was used.  Shortness of Breath Severity:  Moderate Onset quality:  Sudden Duration:  4 days Timing:  Constant Progression:  Waxing and waning Context: not activity   Relieved by:  Nothing Worsened by:  Nothing Ineffective treatments:  None tried Associated symptoms: no abdominal pain, no chest pain, no cough, no diaphoresis, no fever, no headaches, no neck pain, no rash, no vomiting and no wheezing   Risk factors: no hx of cancer and no hx of PE/DVT        Past Medical History:  Diagnosis Date  . Allergy   . Kidney stones    multiple stones passed as child, had lithotripsy once  . Psoriasis     Patient Active Problem List   Diagnosis Date Noted  . Psoriasis 09/07/2018  . Nephrolithiasis 09/07/2018  . Exposure to COVID-19 virus 09/07/2018    Past Surgical History:  Procedure Laterality Date  . LITHOTRIPSY    . lymph node biospy     with splenic infection  . PILONIDAL CYST EXCISION  2008  . WISDOM TOOTH EXTRACTION         Family History  Problem Relation Age of Onset  . Arthritis Father   . Hypertension Mother     Social History   Tobacco Use  . Smoking status: Never Smoker  . Smokeless tobacco: Never Used  Substance Use Topics  . Alcohol use: Yes    Comment: occ, 2-4 drinks a few times per month  . Drug use: No    Home Medications Prior to Admission medications   Medication Sig Start Date End Date Taking? Authorizing Provider  ciprofloxacin-dexamethasone (CIPRODEX) otic suspension Place 4 drops into the left ear 2 (two) times daily. For 7 days Patient not taking: Reported on 11/09/2018 11/29/12   Lucretia Kern, DO  triamcinolone cream  (KENALOG) 0.1 % Apply topically 2 (two) times daily. 08/21/12   Lucretia Kern, DO    Allergies    Patient has no known allergies.  Review of Systems   Review of Systems  Constitutional: Negative for chills, diaphoresis, fatigue and fever.  HENT: Negative for congestion.   Eyes: Negative for visual disturbance.  Respiratory: Positive for chest tightness and shortness of breath. Negative for cough, wheezing and stridor.   Cardiovascular: Negative for chest pain, palpitations and leg swelling.  Gastrointestinal: Negative for abdominal pain, constipation, diarrhea, nausea and vomiting.  Genitourinary: Negative for dysuria, flank pain and frequency.  Musculoskeletal: Negative for back pain, neck pain and neck stiffness.  Skin: Negative for rash and wound.  Neurological: Negative for light-headedness and headaches.  Psychiatric/Behavioral: Negative for agitation and confusion.    Physical Exam Updated Vital Signs BP (!) 138/104 (BP Location: Left Arm)   Pulse (!) 110   Temp 98.1 F (36.7 C) (Oral)   Resp 18   SpO2 98%   Physical Exam Vitals and nursing note reviewed.  Constitutional:      General: He is not in acute distress.    Appearance: He is well-developed. He is not ill-appearing, toxic-appearing or diaphoretic.  HENT:     Head: Normocephalic and atraumatic.  Eyes:     Conjunctiva/sclera: Conjunctivae normal.  Pupils: Pupils are equal, round, and reactive to light.  Cardiovascular:     Rate and Rhythm: Regular rhythm. Tachycardia present.     Heart sounds: No murmur.  Pulmonary:     Effort: Pulmonary effort is normal. No tachypnea or respiratory distress.     Breath sounds: Normal breath sounds. No decreased breath sounds, wheezing, rhonchi or rales.  Chest:     Chest wall: No tenderness.  Abdominal:     Palpations: Abdomen is soft.     Tenderness: There is no abdominal tenderness.  Musculoskeletal:     Cervical back: Neck supple.     Right lower leg: No  tenderness. No edema.     Left lower leg: No tenderness. No edema.  Skin:    General: Skin is warm and dry.     Capillary Refill: Capillary refill takes less than 2 seconds.  Neurological:     General: No focal deficit present.     Mental Status: He is alert.  Psychiatric:        Mood and Affect: Mood normal.     ED Results / Procedures / Treatments   Labs (all labs ordered are listed, but only abnormal results are displayed) Labs Reviewed  RESPIRATORY PANEL BY RT PCR (FLU A&B, COVID)  CBC WITH DIFFERENTIAL/PLATELET  COMPREHENSIVE METABOLIC PANEL  LIPASE, BLOOD  D-DIMER, QUANTITATIVE (NOT AT [facility])  TROPONIN I (HIGH SENSITIVITY)  TROPONIN I (HIGH SENSITIVITY)    EKG EKG Interpretation  Date/Time:  Tuesday April 30 2019 08:50:36 EST Ventricular Rate:  86 PR Interval:    QRS Duration: 90 QT Interval:  333 QTC Calculation: 399 R Axis:   68 Text Interpretation: Sinus rhythm Borderline T wave abnormalities No prior ECG for comparison. T wave inversionin lead 3. No STEMI Confirmed by Theda Belfast ([id number]) on 04/30/2019 9:08:48 AM   Radiology DG Chest 2 View  Result Date: 04/30/2019 CLINICAL DATA:  Shortness of breath, no chest pain no cough. EXAM: CHEST - 2 VIEW COMPARISON:  None FINDINGS: Cardiomediastinal contours and hilar structures are unremarkable. Lungs are clear. Visualized skeletal structures are unremarkable. IMPRESSION: Normal chest Electronically Signed   By: Donzetta Kohut M.D.   On: 04/30/2019 09:26    Procedures Procedures (including critical care time)  Medications Ordered in ED Medications - No data to display  ED Course  I have reviewed the triage vital signs and the nursing notes.  Pertinent labs & imaging results that were available during my care of the patient were reviewed by me and considered in my medical decision making (see chart for details).    MDM Rules/Calculators/A&P                      George Peterson is a 34 y.o. male with a past  medical history significant for kidney stones who presents with difficulty breathing.  He reports that for the last few days, he has had times when he feels he cannot take a deep breath and his breath is catching.  He has never had this before.  He spoke to his PCP who told him come to the emergency department for evaluation.  He reports that a week ago he had a Covid exposure but has not been tested.  He denies fevers, chills, congestion, or cough.  He denies any actual chest pain or palpitations.  He denies history of DVT or PE and denies any leg pain or leg swelling.  He reports no recent long travel.  He denies  any nausea, vomiting, constipation, diarrhea, or urinary symptoms.  He is simply saying he is having some difficulty breathing where he cannot take a deep breath.  He reports it feels very abnormal for him.  He is otherwise healthy.  EKG on arrival shows T wave inversion in lead III.  No EKG present to compare.  Note STEMI.  On exam, lungs are clear and chest is nontender.  Abdomen is nontender.  Good pulses in all extremities.  Legs nontender nonedematous.  Patient resting comfortably but was tachycardic.  Patient will have work-up to rule out Covid, pneumonia, or other abnormality.  Will get D-dimer as he is tachycardic and not able to have the [facility] criteria rule him out for PE.  If work-up is reassuring, dissipate discharge home for outpatient PCP follow-up however we will make sure he does not have concerning etiology for symptoms.  1:56 PM Work-up returned and was overall reassuring.  Delta troponin was negative and undetectable.  D-dimer was negative for PE.  CBC and CMP reassuring.  Chest x-ray reassuring.  Covid test negative.  Covid and flu test negative.  Patient was very for approximately 5 hours with no significant abnormal vital signs and without further symptoms.  We feel he safe for discharge home.  Patient will follow with PCP, will try to stay hydrated and rest.  He agreed with  plan of care and return precautions.  Patient discharged in good condition.    Final Clinical Impression(s) / ED Diagnoses Final diagnoses:  Breathing difficulty    Rx / DC Orders ED Discharge Orders    None     Clinical Impression: 1. Breathing difficulty     Disposition: Discharge  Condition: Good  I have discussed the results, Dx and Tx plan with the pt(& family if present). He/she/they expressed understanding and agree(s) with the plan. Discharge instructions discussed at great length. Strict return precautions discussed and pt &/or family have verbalized understanding of the instructions. No further questions at time of discharge.    New Prescriptions   No medications on file    Follow Up: Wynn Banker, MD [address] [place] [address] [phone]     [facility] [address] [id number] [address]c [place] [address] [phone]       Jereld Presti, Canary Brim, MD 04/30/19 1357

## 2019-04-30 NOTE — Discharge Instructions (Signed)
Your work-up today was completely reassuring.  We did not see any evidence of acute cardiac abnormality with the lab work and your chest x-ray is reassuring.  Your coronavirus test was negative and your test to rule out blood clot showed no blood clot.  Your vital signs were reassuring for the duration of your emergency department stay and thus we feel you are safe for discharge home to follow-up with a primary doctor.  Please rest and stay hydrated.  If any symptoms change or worsen, please return to nearest emergency department.

## 2019-04-30 NOTE — ED Triage Notes (Signed)
Patient called his primary care with a C/C of intermittent SOB and they told him to come to the ED. Patient alert, oriented and ambulatory upon arrival, denies and chest pain, cough, sputum production, but does endorses SOB. Said he was exposed to COVID, someone at work tested postive 2 weeks ago.

## 2019-12-02 ENCOUNTER — Encounter: Payer: Self-pay | Admitting: Family Medicine

## 2019-12-02 ENCOUNTER — Other Ambulatory Visit: Payer: Self-pay

## 2019-12-02 ENCOUNTER — Ambulatory Visit: Payer: 59 | Admitting: Family Medicine

## 2019-12-02 VITALS — BP 110/82 | HR 76 | Temp 98.7°F | Ht 68.0 in | Wt 157.5 lb

## 2019-12-02 DIAGNOSIS — N200 Calculus of kidney: Secondary | ICD-10-CM

## 2019-12-02 DIAGNOSIS — R319 Hematuria, unspecified: Secondary | ICD-10-CM

## 2019-12-02 LAB — POC URINALSYSI DIPSTICK (AUTOMATED)
Bilirubin, UA: NEGATIVE
Glucose, UA: NEGATIVE
Ketones, UA: NEGATIVE
Leukocytes, UA: NEGATIVE
Nitrite, UA: NEGATIVE
Protein, UA: NEGATIVE
Spec Grav, UA: 1.01 (ref 1.010–1.025)
Urobilinogen, UA: 0.2 E.U./dL
pH, UA: 7 (ref 5.0–8.0)

## 2019-12-02 NOTE — Progress Notes (Signed)
George Peterson DOB: [birth year] Encounter date: 12/02/2019  This is a 34 y.o. male who presents with Chief Complaint  Patient presents with  . Hematuria    large amount x1 week  . Back Pain    low back pain x1 week, history of kidney stones and states this feels different in which he now notices aching, seen at an urgent care 3 days ago, no infection noted and given Rx for Tamsulosin    History of present illness: A week and a half ago started to notice blood in urine, which wasn't immediately concerning because he is used to kidney stones. What worried him this time was amount of blood. Usually more discoloration but this time was very red, cloudy. Lower back pain was different than the stone pain - this was more ache; usually stone pain is more stabbing. Coloration improved with water intake. Friday went to urgent care and was told there was blood, but no infection. Given rx for flomax which he got on Saturday and took this Saturday, Sunday. Still with pain. No drastic change. One side effect is that yesterday evening had retrograde ejaculation.   No blood in semen than he noted.   One day had some suprapubic abd pain. Just there a few hours and then gone. Was driving on Friday/monday prior to start of sx.   No fevers. Bowels working ok.   Current pain is on left. Did notice on right one day.   No new trigger that he would have thought cause pain or increase in kidney stone formation.   No Known Allergies Current Meds  Medication Sig  . acetaminophen (TYLENOL) 500 MG tablet Take 1,000 mg by mouth every 6 (six) hours as needed for headache.  . betamethasone dipropionate 0.05 % cream Apply 1 application topically 2 (two) times daily.  . DUOBRII 0.01-0.045 % LOTN Apply 1 application topically daily.  . finasteride (PROSCAR) 5 MG tablet Take 5 mg by mouth daily.  . fluticasone (CUTIVATE) 0.05 % cream Apply 1 application topically 2 (two) times daily. Apply to face or skin fold areas use  1 week on/off as needed.  . Multiple Vitamin (MULTIVITAMIN WITH MINERALS) TABS tablet Take 1 tablet by mouth daily.  . tamsulosin (FLOMAX) 0.4 MG CAPS capsule Take 0.4 mg by mouth daily.  Marland Kitchen triamcinolone cream (KENALOG) 0.1 % Apply topically 2 (two) times daily. (Patient taking differently: Apply 1 application topically 2 (two) times daily as needed (For psoriasis.). )    Review of Systems  Constitutional: Negative for chills, fatigue and fever.  Respiratory: Negative for cough, chest tightness, shortness of breath and wheezing.   Cardiovascular: Negative for chest pain, palpitations and leg swelling.  Genitourinary: Positive for flank pain and hematuria. Negative for difficulty urinating, dysuria, frequency, penile pain, penile swelling, testicular pain and urgency.    Objective:  BP 110/82 (BP Location: Left Arm, Patient Position: Sitting, Cuff Size: Normal)   Pulse 76   Temp 98.7 F (37.1 C) (Oral)   Ht 5\' 8"  (1.727 m)   Wt 157 lb 8 oz (71.4 kg)   BMI 23.95 kg/m   Weight: 157 lb 8 oz (71.4 kg)   BP Readings from Last 3 Encounters:  12/02/19 110/82  04/30/19 130/89  11/09/18 110/80   Wt Readings from Last 3 Encounters:  12/02/19 157 lb 8 oz (71.4 kg)  11/09/18 153 lb 12.8 oz (69.8 kg)  04/23/18 157 lb 14.4 oz (71.6 kg)    Physical Exam Constitutional:  General: He is not in acute distress.    Appearance: He is well-developed.  Cardiovascular:     Rate and Rhythm: Normal rate and regular rhythm.     Heart sounds: Normal heart sounds. No murmur heard.  No friction rub.  Pulmonary:     Effort: Pulmonary effort is normal. No respiratory distress.     Breath sounds: Normal breath sounds. No wheezing or rales.  Abdominal:     General: Abdomen is flat. Bowel sounds are normal. There is no distension.     Tenderness: There is no abdominal tenderness. There is no right CVA tenderness, left CVA tenderness, guarding or rebound.  Musculoskeletal:     Right lower leg: No  edema.     Left lower leg: No edema.  Neurological:     Mental Status: He is alert and oriented to person, place, and time.  Psychiatric:        Behavior: Behavior normal.     Assessment/Plan  1. Nephrolithiasis Suspect kidney stone. He is much more comfortable today than over the weekend. We will recheck urine today here in office. Continue with flomax. I am going to check in with him once I get urine culture results. I have asked him to notify me if increase in blood. We discussed that if not resolving or if pain not improving we can consider either urology referral or CT stone analysis.   2. Hematuria, unspecified type See above.  - Urine Culture; Future - Urinalysis; Future - POCT Urinalysis Dipstick (Automated) - Urinalysis - Urine Culture    Return for pending update when we check in.  Time for visit, discussion of current symptoms and possible evaluation options, exam, charting 30 minutes.    Theodis Shove, MD

## 2019-12-03 LAB — URINE CULTURE
MICRO NUMBER:: 10941991
Result:: NO GROWTH
SPECIMEN QUALITY:: ADEQUATE

## 2019-12-03 LAB — URINALYSIS
Bilirubin Urine: NEGATIVE
Glucose, UA: NEGATIVE
Ketones, ur: NEGATIVE
Leukocytes,Ua: NEGATIVE
Nitrite: NEGATIVE
Protein, ur: NEGATIVE
Specific Gravity, Urine: 1.004 (ref 1.001–1.03)
pH: 7 (ref 5.0–8.0)

## 2019-12-04 ENCOUNTER — Ambulatory Visit: Payer: 59 | Admitting: Internal Medicine

## 2019-12-04 NOTE — Addendum Note (Signed)
Addended by: Johnella Moloney on: 12/04/2019 11:29 AM   Modules accepted: Orders

## 2019-12-06 ENCOUNTER — Other Ambulatory Visit: Payer: Self-pay | Admitting: Urology

## 2019-12-09 ENCOUNTER — Other Ambulatory Visit (HOSPITAL_COMMUNITY)
Admission: RE | Admit: 2019-12-09 | Discharge: 2019-12-09 | Disposition: A | Discharge status: Home / Self Care | Payer: 59 | Source: Ambulatory Visit | Attending: Urology | Admitting: Urology

## 2019-12-09 DIAGNOSIS — Z20822 Contact with and (suspected) exposure to covid-19: Secondary | ICD-10-CM | POA: Insufficient documentation

## 2019-12-09 DIAGNOSIS — Z01812 Encounter for preprocedural laboratory examination: Secondary | ICD-10-CM | POA: Insufficient documentation

## 2019-12-09 LAB — SARS CORONAVIRUS 2 (TAT 6-24 HRS): SARS Coronavirus 2: NEGATIVE

## 2019-12-10 NOTE — Progress Notes (Signed)
Talked with patient, Instructions given. Clear liquids until 700 am. Arrival time 0915. Driver secured.

## 2019-12-11 NOTE — H&P (Signed)
: 10/17/2019: Patient with past medical history significant for nephrolithiasis he typically has exacerbations related to obstructive stone disease 1-2 times per year. He has previously underwent lithotripsy as well as ureteroscopy for stone treatment. Seen last winter for exacerbation of right-sided renal colic and diagnosed with a 3 mm mildly obstructing distal ureteral calculi. On follow-up KUB imaging did not show an obvious ureteral calculi on that side and patient symptoms had improved. He has not followed up since then. He was evaluated in late March in the [facility] system for right sided renal colic. CT imaging performed which showed small bilateral non-obstructing calculi as well as an obstructing 4.26mm distal right ureteral calculi. I am only able to view imaging report in regards to this.   Presents today for concerns of a possible obstructing ureteral calculus.   Historically his stones on previous analysis have been calcium oxalate.   Symptoms began this morning around 6:00 a.m.Marland Kitchen Complaining of pain/discomfort in the left lower back and flank area. No radiation to the abdomen or groin. At the onset of symptoms he took tamsulosin which has helped ease pain a little bit by his report. Not associated with increased frequency or urgency, burning or painful urination, changes in force of stream. He denies fevers or chills, nausea/vomiting   -11/20/19-patient with history of recent left lower back and flank pain. Seen by Anne Fu and treated as presumptive ureteral calculus. KUB at that time showed some renal calculi but no obvious ureteral stone. Patient has been on tamsulosin 0.4 mg daily. Currently patient is asymptomatic. Had ultrasound earlier today which showed some nonobstructing bilateral renal calculi. No evidence of hydronephrosis. KUB is reviewed today and shows calcification previously seen overlying left renal contour on 10/17/2019 now has migrated into the region of the left distal  ureter the region of L3 consistent with upper ureteral calculus. This measures 4-5 mm in size  Urinalysis today shows 10-20 RBCs  -12/05/19-patient with history of left upper ureteral calculus as above. Has been on tamsulosin as medical expulsive therapy. Here for follow-up KUB. The patient over the last week has actually developed some right-sided flank pain radiating around to the right inguinal area. He has had no nausea vomiting or fever. He has had no left-sided pain.  KUB is reviewed today shows persistent calcification in the region of L3 on the left. I see no obvious calcifications to suggest right ureteral stone. Micro urinalysis today is clear  CT urogram was subsequently obtained today and this is reviewed. By initial review this shows an approximate 5-6 mm partially obstructing stone in the region of the left upper ureter at around L3. This is outside the renal shadow. Review of the right side shows a nonobstructing 4-5 mm flat calculus but no evidence of ureteral stone or hydronephrosis on the right. Over read is pending     ALLERGIES: No Allergies    MEDICATIONS: Tamsulosin Hcl 0.4 mg capsule 1 capsule PO Daily  Atorvastatin Calcium  Hydrocodone-Acetaminophen 5 mg-325 mg tablet 1 tablet PO Q 6 H PRN  Lisinopril 10 mg tablet Oral     GU PSH: Cysto Uretero Lithotripsy - 2011 ESWL - 2015, 2015, 2008, 2008 Ureteroscopic stone removal - 2016, 2008       PSH Notes: Cystoscopy With Ureteroscopy With Removal Of Calculus, Lithotripsy, Lithotripsy, Cystoscopy With Ureteroscopy With Lithotripsy, Cystoscopy With Ureteroscopy With Manipulation Of Calculus, Lithotripsy, Lithotripsy   NON-GU PSH: None   GU PMH: Ureteral calculus (Stable) - 11/20/2019, - 2020, Calculus of ureter, -  2016, Calculus of right ureter, - 2014 Renal colic - 2020, - 2020, Renal colic, - 2017 Renal calculus - 2019, (Stable), - 2018, Nephrolithiasis, - 2017 Flank Pain - 2018, Generalized abdominal pain, - 2014 Renal  and ureteral calculus - 2017 Abdominal Pain Unspec, Left flank pain - 2016    NON-GU PMH: Encounter for general adult medical examination without abnormal findings, Encounter for preventive health examination - 2017 Personal history of other diseases of the circulatory system, History of hypertension - 2016 Cardiac murmur, unspecified, Murmurs - 2014    FAMILY HISTORY: Alzheimer's Disease - Runs In Family Cardiac Failure - Runs In Family Family Health Status Number - Runs In Family Kidney Cancer - Runs In Family skin cancer - Father Urologic Disorder - Runs In Family   SOCIAL HISTORY: Marital Status: Married Preferred Language: English; Ethnicity: Not Hispanic Or Latino; Race: White     Notes: Never A Smoker, Occupation:, Tobacco Use, Marital History - Currently Married, Caffeine Use, Alcohol Use   REVIEW OF SYSTEMS:    GU Review Male:   Patient denies frequent urination, hard to postpone urination, burning/ pain with urination, get up at night to urinate, leakage of urine, stream starts and stops, trouble starting your stream, have to strain to urinate , erection problems, and penile pain.  Gastrointestinal (Upper):   Patient denies nausea, vomiting, and indigestion/ heartburn.  Gastrointestinal (Lower):   Patient denies diarrhea and constipation.  Constitutional:   Patient denies fever, night sweats, weight loss, and fatigue.  Skin:   Patient denies skin rash/ lesion and itching.  Eyes:   Patient denies blurred vision and double vision.  Ears/ Nose/ Throat:   Patient denies sore throat and sinus problems.  Hematologic/Lymphatic:   Patient denies swollen glands and easy bruising.  Cardiovascular:   Patient denies leg swelling and chest pains.  Respiratory:   Patient denies shortness of breath and cough.  Endocrine:   Patient denies excessive thirst.  Musculoskeletal:   Patient reports back pain. Patient denies joint pain.  Neurological:   Patient denies headaches and dizziness.   Psychologic:   Patient denies depression and anxiety.   Notes: Now Right Flank pain has arrived all last week    VITAL SIGNS:      12/05/2019 10:54 AM  Weight 220 lb / 99.79 kg  Height 69 in / 175.26 cm  BP 102/68 mmHg  Heart Rate 96 /min  Temperature 97.6 F / 36.4 C  BMI 32.5 kg/m   GU PHYSICAL EXAMINATION:    Anus and Perineum: No hemorrhoids. No anal stenosis. No rectal fissure, no anal fissure. No edema, no dimple, no perineal tenderness, no anal tenderness.  Scrotum: No lesions. No edema. No cysts. No warts.  Epididymides: Right: no spermatocele, no masses, no cysts, no tenderness, no induration, no enlargement. Left: no spermatocele, no masses, no cysts, no tenderness, no induration, no enlargement.  Testes: No tenderness, no swelling, no enlargement left testes. No tenderness, no swelling, no enlargement right testes. Normal location left testes. Normal location right testes. No mass, no cyst, no varicocele, no hydrocele left testes. No mass, no cyst, no varicocele, no hydrocele right testes.  Urethral Meatus: Normal size. No lesion, no wart, no discharge, no polyp. Normal location.  Penis: Circumcised, no warts, no cracks. No dorsal Peyronie's plaques, no left corporal Peyronie's plaques, no right corporal Peyronie's plaques, no scarring, no warts. No balanitis, no meatal stenosis.  Prostate: 40 gram or 2+ size. Left lobe normal consistency, right lobe normal consistency.  Symmetrical lobes. No prostate nodule. Left lobe no tenderness, right lobe no tenderness.  Seminal Vesicles: Nonpalpable.  Sphincter Tone: Normal sphincter. No rectal tenderness. No rectal mass.    MULTI-SYSTEM PHYSICAL EXAMINATION:    Constitutional: Well-nourished. No physical deformities. Normally developed. Good grooming.  Neck: Neck symmetrical, not swollen. Normal tracheal position.  Respiratory: No labored breathing, no use of accessory muscles.   Cardiovascular: Normal temperature, normal extremity  pulses, no swelling, no varicosities.  Lymphatic: No enlargement of neck, axillae, groin.  Skin: No paleness, no jaundice, no cyanosis. No lesion, no ulcer, no rash.  Neurologic / Psychiatric: Oriented to time, oriented to place, oriented to person. No depression, no anxiety, no agitation.  Gastrointestinal: No mass, no tenderness, no rigidity, non obese abdomen.  Eyes: Normal conjunctivae. Normal eyelids.  Ears, Nose, Mouth, and Throat: Left ear no scars, no lesions, no masses. Right ear no scars, no lesions, no masses. Nose no scars, no lesions, no masses. Normal hearing. Normal lips.  Musculoskeletal: Normal gait and station of head and neck.     Complexity of Data:  Records Review:   Previous Doctor Records, Previous Patient Records  X-[place] Review: C.T. Abdomen/Pelvis: Reviewed Films. Reviewed Report. Discussed With Patient.     PROCEDURES:         C.T. ABD-Pelv w/o - [id number]      . Patient confirmed No Neulasta OnPro Device.          KUB - [id number]  A single view of the abdomen is obtained.      . Patient confirmed No Neulasta OnPro Device. KUB is reviewed today shows persistent calcification in the region of L3 on the left consistent with possible ureteral calculus measuring 4-5 mm in size. No obvious stones noted on the right side. No obvious bony abnormalities noted.          Urinalysis - 81003 Dipstick Dipstick Cont'd  Color: Yellow Bilirubin: Neg  Appearance: Clear Ketones: Neg  Specific Gravity: 1.025 Blood: Neg  pH: 5.5 Protein: Neg  Glucose: Neg Urobilinogen: 0.2    Nitrites: Neg    Leukocyte Esterase: Neg    Notes:      ASSESSMENT:      ICD-10 Details  1 GU:   Ureteral calculus - N20.1 Acute, Complicated Injury   PLAN:           Orders X-Rays: C.T. Abdomen/Pelvis Without Contrast. No Oral Contrast  X-[place] Notes: . History:  Hematuria: Yes/No  Patient to see MD after exam: Yes/No  Previous exam: CT / IVP/ US/ KUB/ None  When:  Where:  Diabetic:  Yes/ No  BUN/ Creatinine:  Date of last BUN Creatinine:  Weight in pounds:  Allergy- IV Contrast: Yes/ No  Conflicting diabetic meds: Yes/ No  Diabetic Meds:  Prior Authorization #: [id number]          Schedule         Document Letter(s):  Created for Patient: Clinical Summary         Notes:   I discussed treatment options with the patient. In that he is minimally symptomatic will try to schedule for in situ ESL in the near future. Risks and benefits discussed as outlined below.  I have discussed with the patient the risks and consequences of the procedure of extracorporeal shockwave lithotripsy to include, but not limited to: Bleeding, including bleeding in the urine, bleeding around the kidney with hematoma formation and rarely bleeding to the point of loss of the kidney, infection, damage  to the surrounding structures including soft tissue and bowel perforation, residual stone fragments requiring the need for future treatments including endoscopic surgery, open surgery or percutaneous surgery. I have emphasized that study showed that up to 25% of patients will require additional procedures depending on stone size and composition. I have also discussed with the patient that the success rate for ESWL is approximately 60-90% and depends on stone location and stone composition as well as preoperative stone size. The patient voices understanding of the risks and benefits of the above and consents to the procedure.      

## 2019-12-12 ENCOUNTER — Emergency Department (HOSPITAL_COMMUNITY)
Admission: EM | Admit: 2019-12-12 | Discharge: 2019-12-12 | Disposition: A | Discharge status: Home / Self Care | Payer: 59 | Source: Home / Self Care | Attending: Emergency Medicine | Admitting: Emergency Medicine

## 2019-12-12 ENCOUNTER — Ambulatory Visit (HOSPITAL_COMMUNITY): Payer: 59

## 2019-12-12 ENCOUNTER — Encounter (HOSPITAL_BASED_OUTPATIENT_CLINIC_OR_DEPARTMENT_OTHER)
Admission: RE | Disposition: A | Discharge status: Home / Self Care | Payer: Self-pay | Source: Home / Self Care | Attending: Urology

## 2019-12-12 ENCOUNTER — Ambulatory Visit (HOSPITAL_BASED_OUTPATIENT_CLINIC_OR_DEPARTMENT_OTHER)
Admission: RE | Admit: 2019-12-12 | Discharge: 2019-12-12 | Disposition: A | Discharge status: Home / Self Care | Payer: 59 | Attending: Urology | Admitting: Urology

## 2019-12-12 ENCOUNTER — Other Ambulatory Visit: Payer: Self-pay

## 2019-12-12 ENCOUNTER — Encounter (HOSPITAL_BASED_OUTPATIENT_CLINIC_OR_DEPARTMENT_OTHER): Payer: Self-pay | Admitting: Urology

## 2019-12-12 ENCOUNTER — Emergency Department (HOSPITAL_COMMUNITY): Payer: 59

## 2019-12-12 DIAGNOSIS — Z8249 Family history of ischemic heart disease and other diseases of the circulatory system: Secondary | ICD-10-CM | POA: Insufficient documentation

## 2019-12-12 DIAGNOSIS — Z8679 Personal history of other diseases of the circulatory system: Secondary | ICD-10-CM | POA: Diagnosis not present

## 2019-12-12 DIAGNOSIS — Z841 Family history of disorders of kidney and ureter: Secondary | ICD-10-CM | POA: Insufficient documentation

## 2019-12-12 DIAGNOSIS — N202 Calculus of kidney with calculus of ureter: Secondary | ICD-10-CM | POA: Insufficient documentation

## 2019-12-12 DIAGNOSIS — N200 Calculus of kidney: Secondary | ICD-10-CM | POA: Insufficient documentation

## 2019-12-12 DIAGNOSIS — Z79899 Other long term (current) drug therapy: Secondary | ICD-10-CM | POA: Diagnosis not present

## 2019-12-12 DIAGNOSIS — N201 Calculus of ureter: Secondary | ICD-10-CM

## 2019-12-12 HISTORY — PX: EXTRACORPOREAL SHOCK WAVE LITHOTRIPSY: SHX1557

## 2019-12-12 LAB — CBC WITH DIFFERENTIAL/PLATELET
Abs Immature Granulocytes: 0.06 10*3/uL (ref 0.00–0.07)
Basophils Absolute: 0 10*3/uL (ref 0.0–0.1)
Basophils Relative: 0 %
Eosinophils Absolute: 0 10*3/uL (ref 0.0–0.5)
Eosinophils Relative: 0 %
HCT: 39.8 % (ref 39.0–52.0)
Hemoglobin: 14.1 g/dL (ref 13.0–17.0)
Immature Granulocytes: 0 %
Lymphocytes Relative: 7 %
Lymphs Abs: 1 10*3/uL (ref 0.7–4.0)
MCH: 30.2 pg (ref 26.0–34.0)
MCHC: 35.4 g/dL (ref 30.0–36.0)
MCV: 85.2 fL (ref 80.0–100.0)
Monocytes Absolute: 0.6 10*3/uL (ref 0.1–1.0)
Monocytes Relative: 4 %
Neutro Abs: 13.1 10*3/uL — ABNORMAL HIGH (ref 1.7–7.7)
Neutrophils Relative %: 89 %
Platelets: 202 10*3/uL (ref 150–400)
RBC: 4.67 MIL/uL (ref 4.22–5.81)
RDW: 12.4 % (ref 11.5–15.5)
WBC: 14.7 10*3/uL — ABNORMAL HIGH (ref 4.0–10.5)
nRBC: 0 % (ref 0.0–0.2)

## 2019-12-12 LAB — URINALYSIS, ROUTINE W REFLEX MICROSCOPIC
Bilirubin Urine: NEGATIVE
Glucose, UA: NEGATIVE mg/dL
Ketones, ur: NEGATIVE mg/dL
Leukocytes,Ua: NEGATIVE
Nitrite: NEGATIVE
Protein, ur: 100 mg/dL — AB
RBC / HPF: >50 RBC/hpf — ABNORMAL HIGH (ref 0–5)
Specific Gravity, Urine: 1.005 (ref 1.005–1.030)
pH: 6 (ref 5.0–8.0)

## 2019-12-12 LAB — BASIC METABOLIC PANEL
Anion gap: 8 (ref 5–15)
BUN: 9 mg/dL (ref 6–20)
CO2: 26 mmol/L (ref 22–32)
Calcium: 9 mg/dL (ref 8.9–10.3)
Chloride: 102 mmol/L (ref 98–111)
Creatinine, Ser: 0.84 mg/dL (ref 0.61–1.24)
GFR calc Af Amer: >60 mL/min (ref >60)
GFR calc non Af Amer: >60 mL/min (ref >60)
Glucose, Bld: 120 mg/dL — ABNORMAL HIGH (ref 70–99)
Potassium: 3.6 mmol/L (ref 3.5–5.1)
Sodium: 136 mmol/L (ref 135–145)

## 2019-12-12 SURGERY — LITHOTRIPSY, ESWL
Anesthesia: LOCAL | Laterality: Left

## 2019-12-12 MED ORDER — DIAZEPAM 5 MG PO TABS
ORAL_TABLET | ORAL | Status: AC
  Filled 2019-12-12: qty 2

## 2019-12-12 MED ORDER — SODIUM CHLORIDE 0.9 % IV SOLN: INTRAVENOUS | Status: DC

## 2019-12-12 MED ORDER — SODIUM CHLORIDE 0.9 % IV BOLUS
1000.0000 mL | Freq: Once | INTRAVENOUS | Status: AC
  Administered 2019-12-12: 1000 mL via INTRAVENOUS

## 2019-12-12 MED ORDER — HYDROMORPHONE HCL 1 MG/ML IJ SOLN
1.0000 mg | Freq: Once | INTRAMUSCULAR | Status: AC
  Administered 2019-12-12: 1 mg via INTRAVENOUS
  Filled 2019-12-12: qty 1

## 2019-12-12 MED ORDER — ONDANSETRON 4 MG PO TBDP: 4.0000 mg | ORAL_TABLET | Freq: Three times a day (TID) | ORAL | 0 refills | Status: DC | PRN

## 2019-12-12 MED ORDER — DIPHENHYDRAMINE HCL 25 MG PO CAPS
25.0000 mg | ORAL_CAPSULE | ORAL | Status: AC
  Administered 2019-12-12: 25 mg via ORAL

## 2019-12-12 MED ORDER — DIAZEPAM 5 MG PO TABS
10.0000 mg | ORAL_TABLET | ORAL | Status: AC
  Administered 2019-12-12: 10 mg via ORAL

## 2019-12-12 MED ORDER — CIPROFLOXACIN HCL 500 MG PO TABS
500.0000 mg | ORAL_TABLET | ORAL | Status: AC
  Administered 2019-12-12: 500 mg via ORAL

## 2019-12-12 MED ORDER — CIPROFLOXACIN HCL 500 MG PO TABS
ORAL_TABLET | ORAL | Status: AC
  Filled 2019-12-12: qty 1

## 2019-12-12 MED ORDER — ONDANSETRON HCL 4 MG/2ML IJ SOLN
4.0000 mg | Freq: Once | INTRAMUSCULAR | Status: AC
  Administered 2019-12-12: 4 mg via INTRAVENOUS
  Filled 2019-12-12: qty 2

## 2019-12-12 MED ORDER — DIPHENHYDRAMINE HCL 25 MG PO CAPS
ORAL_CAPSULE | ORAL | Status: AC
  Filled 2019-12-12: qty 1

## 2019-12-12 NOTE — ED Notes (Signed)
Pt unable to urinate at this time. Will continue to monitor.

## 2019-12-12 NOTE — ED Triage Notes (Signed)
Pt arrived via walk in, c/o worsening pain after lithotripsy today. Had lithotripsy around 11am, was okay after, devloped intense right flank pain approx 3 hrs PTA

## 2019-12-12 NOTE — Discharge Instructions (Signed)
Take the pain medication and the flomax you have at home.  I have written for Zofran a nausea medication.  Follow up with Urology  Return for new or worsening symptoms such as increased pain not controlled after pain medication, intractable vomiting, fever

## 2019-12-12 NOTE — Interval H&P Note (Signed)
History and Physical Interval Note:  12/12/2019 10:31 AM  George Peterson  has presented today for surgery, with the diagnosis of LEFT URETERAL STON.  The various methods of treatment have been discussed with the patient and family. After consideration of risks, benefits and other options for treatment, the patient has consented to  Procedure(s): LEFT EXTRACORPOREAL SHOCK WAVE LITHOTRIPSY (ESWL) (Left) as a surgical intervention.  The patient's history has been reviewed, patient examined, no change in status, stable for surgery.  I have reviewed the patient's chart and labs.  Questions were answered to the patient's satisfaction.     Belva Agee

## 2019-12-12 NOTE — ED Notes (Signed)
Pt given ginger ale at this time for PO fluid challenge.  

## 2019-12-12 NOTE — Discharge Instructions (Signed)
Lithotripsy, Care After This sheet gives you information about how to care for yourself after your procedure. Your health care provider may also give you more specific instructions. If you have problems or questions, contact your health care provider. What can I expect after the procedure? After the procedure, it is common to have:  Some blood in your urine. This should only last for a few days.  Soreness in your back, sides, or upper abdomen for a few days.  Blotches or bruises on your back where the pressure wave entered the skin.  Pain, discomfort, or nausea when pieces (fragments) of the kidney stone move through the tube that carries urine from the kidney to the bladder (ureter). Stone fragments may pass soon after the procedure, but they may continue to pass for up to 4-8 weeks. ? If you have severe pain or nausea, contact your health care provider. This may be caused by a large stone that was not broken up, and this may mean that you need more treatment.  Some pain or discomfort during urination.  Some pain or discomfort in the lower abdomen or (in men) at the base of the penis. Follow these instructions at home: Medicines  Take over-the-counter and prescription medicines only as told by your health care provider.  If you were prescribed an antibiotic medicine, take it as told by your health care provider. Do not stop taking the antibiotic even if you start to feel better.  Do not drive for 24 hours if you were given a medicine to help you relax (sedative).  Do not drive or use heavy machinery while taking prescription pain medicine. Eating and drinking      Drink enough water and fluids to keep your urine clear or pale yellow. This helps any remaining pieces of the stone to pass. It can also help prevent new stones from forming.  Eat plenty of fresh fruits and vegetables.  Follow instructions from your health care provider about eating and drinking restrictions. You may be  instructed: ? To reduce how much salt (sodium) you eat or drink. Check ingredients and nutrition facts on packaged foods and beverages. ? To reduce how much meat you eat.  Eat the recommended amount of calcium for your age and gender. Ask your health care provider how much calcium you should have. General instructions  Get plenty of rest.  Most people can resume normal activities 1-2 days after the procedure. Ask your health care provider what activities are safe for you.  Your health care provider may direct you to lie in a certain position (postural drainage) and tap firmly (percuss) over your kidney area to help stone fragments pass. Follow instructions as told by your health care provider.  If directed, strain all urine through the strainer that was provided by your health care provider. ? Keep all fragments for your health care provider to see. Any stones that are found may be sent to a medical lab for examination. The stone may be as small as a grain of salt.  Keep all follow-up visits as told by your health care provider. This is important. Contact a health care provider if:  You have pain that is severe or does not get better with medicine.  You have nausea that is severe or does not go away.  You have blood in your urine longer than your health care provider told you to expect.  You have more blood in your urine.  You have pain during urination that does   not go away.  You urinate more frequently than usual and this does not go away.  You develop a rash or any other possible signs of an allergic reaction. Get help right away if:  You have severe pain in your back, sides, or upper abdomen.  You have severe pain while urinating.  Your urine is very dark red.  You have blood in your stool (feces).  You cannot pass any urine at all.  You feel a strong urge to urinate after emptying your bladder.  You have a fever or chills.  You develop shortness of breath,  difficulty breathing, or chest pain.  You have severe nausea that leads to persistent vomiting.  You faint. Summary  After this procedure, it is common to have some pain, discomfort, or nausea when pieces (fragments) of the kidney stone move through the tube that carries urine from the kidney to the bladder (ureter). If this pain or nausea is severe, however, you should contact your health care provider.  Most people can resume normal activities 1-2 days after the procedure. Ask your health care provider what activities are safe for you.  Drink enough water and fluids to keep your urine clear or pale yellow. This helps any remaining pieces of the stone to pass, and it can help prevent new stones from forming.  If directed, strain your urine and keep all fragments for your health care provider to see. Fragments or stones may be as small as a grain of salt.  Get help right away if you have severe pain in your back, sides, or upper abdomen or have severe pain while urinating. This information is not intended to replace advice given to you by your health care provider. Make sure you discuss any questions you have with your health care provider. Document Revised: 06/18/2018 Document Reviewed: 01/27/2016 Elsevier Patient Education  2020 [employer]    Post Anesthesia Home Care Instructions  Activity: Get plenty of rest for the remainder of the day. A responsible individual must stay with you for 24 hours following the procedure.  For the next 24 hours, DO NOT: -Drive a car -Operate machinery -Drink alcoholic beverages -Take any medication unless instructed by your physician -Make any legal decisions or sign important papers.  Meals: Start with liquid foods such as gelatin or soup. Progress to regular foods as tolerated. Avoid greasy, spicy, heavy foods. If nausea and/or vomiting occur, drink only clear liquids until the nausea and/or vomiting subsides. Call your physician if vomiting  continues.  Special Instructions/Symptoms: Your throat may feel dry or sore from the anesthesia or the breathing tube placed in your throat during surgery. If this causes discomfort, gargle with warm salt water. The discomfort should disappear within 24 hours.  If you had a scopolamine patch placed behind your ear for the management of post- operative nausea and/or vomiting:  1. The medication in the patch is effective for 72 hours, after which it should be removed.  Wrap patch in a tissue and discard in the trash. Wash hands thoroughly with soap and water. 2. You may remove the patch earlier than 72 hours if you experience unpleasant side effects which may include dry mouth, dizziness or visual disturbances. 3. Avoid touching the patch. Wash your hands with soap and water after contact with the patch.     

## 2019-12-12 NOTE — Brief Op Note (Signed)
12/12/2019  12:05 PM  PATIENT:  George Peterson  34 y.o. male  PRE-OPERATIVE DIAGNOSIS:  LEFT URETERAL STON  POST-OPERATIVE DIAGNOSIS:  * No post-op diagnosis entered *  PROCEDURE:  Procedure(s): LEFT EXTRACORPOREAL SHOCK WAVE LITHOTRIPSY (ESWL) (Left)  SURGEON:  Surgeon(s) and Role:    * Belva Agee, MD - Primary  PHYSICIAN ASSISTANT:   ASSISTANTS: none   ANESTHESIA:   IV sedation  EBL:  minimal   BLOOD ADMINISTERED:none  DRAINS: none   LOCAL MEDICATIONS USED:  NONE  SPECIMEN:  No Specimen  DISPOSITION OF SPECIMEN:  N/A  COUNTS:  YES  TOURNIQUET:  * No tourniquets in log *  DICTATION: .Note written in EPIC  PLAN OF CARE: Discharge to home after PACU  PATIENT DISPOSITION:  PACU - hemodynamically stable.   Delay start of Pharmacological VTE agent (>24hrs) due to surgical blood loss or risk of bleeding: not applicable

## 2019-12-12 NOTE — ED Provider Notes (Signed)
[facility]-EMERGENCY DEPT Provider Note   CSN: [id number] Arrival date & time: 12/12/19  1658    History Chief Complaint  Patient presents with  . Flank Pain    George Peterson is a 34 y.o. male with past medical history significant for renal stones, recent lithotripsy approximately 8 hours ago who presents for evaluation of worsening left flank pain.  Patient states went home around 11:30 AM.  Was fine initially after his procedure however has had sharp, stabbing flank pain.  Patient states he attempted to take a dose of Norco at home however has had multiple episodes of emesis and has been unable to keep down anything since his procedure.  He rates his pain a 10/10.  No radiation.  Does have some tenderness to distal penis at urethra.  Has not noticed any swelling, redness to scrotum or penis.  Has had hematuria however states he has had this over the last 10 days. Denies fever, chills, chest pain, shortness of breath, pain with bowel movements, rashes, lesions. Denies additional aggravating or relieving factors.  History obtained from patient and past medical records.  No interpreter is used.  HPI     Past Medical History:  Diagnosis Date  . Allergy   . Kidney stones    multiple stones passed as child, had lithotripsy once  . Psoriasis     Patient Active Problem List   Diagnosis Date Noted  . Psoriasis 09/07/2018  . Nephrolithiasis 09/07/2018  . Exposure to COVID-19 virus 09/07/2018    Past Surgical History:  Procedure Laterality Date  . LITHOTRIPSY    . lymph node biospy     with splenic infection  . PILONIDAL CYST EXCISION  2008  . WISDOM TOOTH EXTRACTION         Family History  Problem Relation Age of Onset  . Arthritis Father   . Hypertension Mother     Social History   Tobacco Use  . Smoking status: Never Smoker  . Smokeless tobacco: Never Used  Substance Use Topics  . Alcohol use: Yes    Comment: occ, 2-4 drinks a few times per  month  . Drug use: No    Home Medications Prior to Admission medications   Medication Sig Start Date End Date Taking? Authorizing Provider  acetaminophen (TYLENOL) 500 MG tablet Take 500 mg by mouth every 6 (six) hours as needed for headache.    Yes [provider]  betamethasone dipropionate 0.05 % cream Apply 1 application topically 2 (two) times daily as needed.  04/15/19  Yes [provider]  DUOBRII 0.01-0.045 % LOTN Apply 1 application topically daily. 04/15/19  Yes [provider]  finasteride (PROSCAR) 5 MG tablet Take 5 mg by mouth daily.   Yes [provider]  fluticasone (CUTIVATE) 0.05 % cream Apply 1 application topically 2 (two) times daily. Apply to face or skin fold areas use 1 week on/off as needed.   Yes [provider]  fluticasone (FLONASE) 50 MCG/ACT nasal spray Place 1 spray into both nostrils daily as needed for allergies or rhinitis.   Yes [provider]  Multiple Vitamin (MULTIVITAMIN WITH MINERALS) TABS tablet Take 1 tablet by mouth daily.   Yes [provider]  oxyCODONE-acetaminophen (PERCOCET/ROXICET) 5-325 MG tablet Take 1 tablet by mouth every 6 (six) hours as needed for moderate pain.  12/05/19  Yes [provider]  tamsulosin (FLOMAX) 0.4 MG CAPS capsule Take 0.4 mg by mouth daily. 11/29/19  Yes [provider]  triamcinolone cream (KENALOG) 0.1 % Apply topically 2 (two) times daily. Patient taking differently: Apply 1 application topically 2 (two) times daily as needed (For psoriasis.).  08/21/12  Yes Kriste Basque R, DO  ondansetron (ZOFRAN ODT) 4 MG disintegrating tablet Take 1 tablet (4 mg total) by mouth every 8 (eight) hours as needed for nausea or vomiting. 12/12/19   Ishika Chesterfield A, PA-C    Allergies    Patient has no known allergies.  Review of Systems   Review of Systems  Constitutional: Negative.   HENT: Negative.   Respiratory: Negative.   Cardiovascular: Negative.     Gastrointestinal: Negative.   Genitourinary: Positive for decreased urine volume, flank pain and hematuria. Negative for difficulty urinating and urgency.  Skin: Negative.   Neurological: Negative.   All other systems reviewed and are negative.   Physical Exam Updated Vital Signs BP (!) 144/90 (BP Location: Left Arm)   Pulse (!) 110   Temp 98.2 F (36.8 C) (Oral)   Resp (!) 22   SpO2 99%   Physical Exam Vitals and nursing note reviewed.  Constitutional:      General: He is not in acute distress.    Appearance: He is well-developed. He is not ill-appearing, toxic-appearing or diaphoretic.     Comments: Tossing back and forth in pain on bed  HENT:     Head: Normocephalic and atraumatic.     Nose: Nose normal.     Mouth/Throat:     Mouth: Mucous membranes are moist.  Eyes:     Pupils: Pupils are equal, round, and reactive to light.  Cardiovascular:     Rate and Rhythm: Normal rate and regular rhythm.     Pulses: Normal pulses.     Heart sounds: Normal heart sounds.  Pulmonary:     Effort: Pulmonary effort is normal. No respiratory distress.     Breath sounds: Normal breath sounds.  Abdominal:     General: Bowel sounds are normal. There is no distension.     Palpations: Abdomen is soft.     Tenderness: There is abdominal tenderness. There is left CVA tenderness. There is no guarding or rebound. Negative signs include Murphy's sign and McBurney's sign.     Hernia: No hernia is present.     Comments: Erythema to left flank (patient rubbing area). No fluctuance or induration. Tenderness to left flank and LLQ.  Genitourinary:    Penis: Normal.      Testes: Normal. Cremasteric reflex is present.     Epididymis:     Right: Normal.     Left: Normal.  Musculoskeletal:        General: Normal range of motion.     Cervical back: Normal range of motion and neck supple.     Comments: Moves all 4 extremities without difficulty  Skin:    General: Skin is warm and dry.      Capillary Refill: Capillary refill takes less than 2 seconds.     Comments: No edema, fluctuance, induration  Neurological:     Mental Status: He is alert.    ED Results / Procedures / Treatments   Labs (all labs ordered are listed, but only abnormal results are displayed) Labs Reviewed  CBC WITH DIFFERENTIAL/PLATELET - Abnormal; Notable for the following components:      Result Value   WBC 14.7 (*)    Neutro Abs 13.1 (*)    All other components within normal limits  BASIC METABOLIC PANEL - Abnormal;  Notable for the following components:   Glucose, Bld 120 (*)    All other components within normal limits  URINALYSIS, ROUTINE W REFLEX MICROSCOPIC - Abnormal; Notable for the following components:   Color, Urine AMBER (*)    APPearance HAZY (*)    Hgb urine dipstick LARGE (*)    Protein, ur 100 (*)    RBC / HPF >50 (*)    Bacteria, UA RARE (*)    All other components within normal limits  URINE CULTURE    EKG None  Radiology DG Abd 1 View  Result Date: 12/12/2019 CLINICAL DATA:  Preprocedure for left kidney stone. EXAM: ABDOMEN - 1 VIEW COMPARISON:  12/05/2019 FINDINGS: Stable 7-8 mm stone in the region of the left UPJ. Bowel gas pattern is nonobstructive with mild fecal retention throughout the colon. Partial sacralization of the left transverse process of L5. IMPRESSION: 1. 7-8 mm stone in the region of the left UPJ unchanged. 2. Nonobstructive bowel gas pattern. Electronically Signed   By: Elberta Fortis M.D.   On: 12/12/2019 15:49   CT Renal Stone Study  Result Date: 12/12/2019 CLINICAL DATA:  Left flank pain, recent lithotripsy EXAM: CT ABDOMEN AND PELVIS WITHOUT CONTRAST TECHNIQUE: Multidetector CT imaging of the abdomen and pelvis was performed following the standard protocol without IV contrast. COMPARISON:  12/05/2019, 12/12/2019 FINDINGS: Lower chest: No acute pleural or parenchymal lung disease. Hepatobiliary: No focal liver abnormality is seen. No gallstones,  gallbladder wall thickening, or biliary dilatation. Pancreas: Unremarkable. No pancreatic ductal dilatation or surrounding inflammatory changes. Spleen: Normal in size without focal abnormality. Adrenals/Urinary Tract: A 7 mm obstructing calculus at the left UPJ on previous study has undergone lithotripsy and fragmentation in the interim. There is a 3 mm fragment within the proximal left ureter reference image 48. 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 There is progressive mild left-sided obstructive uropathy, with multiple small nonobstructing left renal calculi visualized. The left kidney is edematous, with trace left perirenal hemorrhage, consistent with expected sequela of recent lithotripsy. Punctate 2 mm nonobstructing right renal calculus is unchanged. Bladder is moderately distended. There are least 3 small calculi seen within the bladder lumen consistent with migrated fragments from the left kidney after lithotripsy. The adrenals are unremarkable. Stomach/Bowel: No bowel obstruction or ileus. Normal appendix right lower quadrant. No wall thickening or inflammatory change. Vascular/Lymphatic: No significant vascular findings are present. No enlarged abdominal or pelvic lymph nodes. Reproductive: Prostate is unremarkable. Other: No free fluid or free gas.  No abdominal wall hernia. Musculoskeletal: No acute or destructive bony lesions. Reconstructed images demonstrate no additional findings. IMPRESSION: 1. Interval fragmentation of the left UPJ calculus after lithotripsy, with distal migration of the fracture fragments. 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 2. Left renal edema and trace left perirenal hemorrhage, consistent with expected sequela of recent lithotripsy. 3. Punctate nonobstructing right renal and bladder calculi as above.  Electronically Signed   By: Sharlet Salina M.D.   On: 12/12/2019 19:38    Procedures Procedures (including critical care time)  Medications Ordered in ED Medications  HYDROmorphone (DILAUDID) injection 1 mg (1 mg Intravenous Given 12/12/19 1940)  sodium chloride 0.9 % bolus 1,000 mL (1,000 mLs Intravenous New Bag/Given (Non-Interop) 12/12/19 1938)  ondansetron (ZOFRAN) injection 4 mg (4 mg Intravenous Given 12/12/19 1939)   ED Course  I have reviewed the triage vital signs and the nursing  notes.  Pertinent labs & imaging results that were available during my care of the patient were reviewed by me and considered in my medical decision making (see chart for details).  34 year old male presents for evaluation of worsening left flank pain after undergoing lithotripsy approximately 8 hours PTA.  Has had multiple episodes of NBNB emesis.  Patient is afebrile, nonseptic, not ill-appearing.  Tenderness to left flank.  He does not appear septic however he is mildly tachycardic however will not sit still due to pain, writhing back and forth. Plan on labs, imaging and reassess.  Labs and imaging personally reviewed and interpreted:  CBC leukocytosis at 14.7, Hgb 14.1 BMP hyperglycemia at 120 however no additional electrolyte, renal or liver abnormality  UA negative for infection, hematuria however to be expected with recent lithotripsy CT Stone fragmented stones, left renal edema, trace perirenal hemorrhage consistent with recent lithotripsy  Patient reassessed.  Pain resolved.  Tolerating p.o. intake without difficulty.  Has been able to urinate without difficulty.  CONSULT with Dr. Mena GoesEskridge with Urology. States trace perirenal hemorrhage after lithotripsy is normal.  Will dc patient home. Has flomax and pain meds at home. Will dc with Zofran rx. Tolerating PO intake without difficulty.  Patient is nontoxic, nonseptic appearing, in no apparent distress.  Patient's pain and other symptoms  adequately managed in emergency department.  Fluid bolus given.  Labs, imaging and vitals reviewed.  Patient does not meet the SIRS or Sepsis criteria.  On repeat exam patient does not have a surgical abdomin and there are no peritoneal signs.  No indication of appendicitis, bowel obstruction, bowel perforation, cholecystitis, diverticulitis.  Patient symptoms likely due to passing kidney stone.  Have low suspicion for sepsis, bacterial infectious process.  Pain controlled here in the emergency department.  He will follow-up outpatient with urology.  The patient has been appropriately medically screened and/or stabilized in the ED. I have low suspicion for any other emergent medical condition which would require further screening, evaluation or treatment in the ED or require inpatient management.  Patient is hemodynamically stable and in no acute distress.  Patient able to ambulate in department prior to ED.  Evaluation does not show acute pathology that would require ongoing or additional emergent interventions while in the emergency department or further inpatient treatment.  I have discussed the diagnosis with the patient and answered all questions.  Pain is been managed while in the emergency department and patient has no further complaints prior to discharge.  Patient is comfortable with plan discussed in room and is stable for discharge at this time.  I have discussed strict return precautions for returning to the emergency department.  Patient was encouraged to follow-up with PCP/specialist refer to at discharge.    MDM Rules/Calculators/A&P                           Final Clinical Impression(s) / ED Diagnoses Final diagnoses:  Nephrolithiasis    Rx / DC Orders ED Discharge Orders         Ordered    ondansetron (ZOFRAN ODT) 4 MG disintegrating tablet  Every 8 hours PRN        12/12/19 2142           Tarry Fountain A, PA-C 12/12/19 2202    Sabino DonovanKatz, Eric C, MD 12/12/19 [id number]

## 2019-12-13 ENCOUNTER — Encounter (HOSPITAL_BASED_OUTPATIENT_CLINIC_OR_DEPARTMENT_OTHER): Payer: Self-pay | Admitting: Urology

## 2019-12-14 LAB — URINE CULTURE: Culture: NO GROWTH

## 2020-06-28 ENCOUNTER — Encounter: Payer: Self-pay | Admitting: Family Medicine

## 2020-07-06 ENCOUNTER — Other Ambulatory Visit: Payer: Self-pay

## 2020-07-06 ENCOUNTER — Encounter: Payer: Self-pay | Admitting: Family Medicine

## 2020-07-06 ENCOUNTER — Ambulatory Visit: Payer: 59 | Admitting: Family Medicine

## 2020-07-06 VITALS — BP 120/64 | HR 75 | Temp 98.0°F | Wt 159.2 lb

## 2020-07-06 DIAGNOSIS — R002 Palpitations: Secondary | ICD-10-CM

## 2020-07-06 NOTE — Patient Instructions (Signed)

## 2020-07-06 NOTE — Progress Notes (Signed)
Established Patient Office Visit  Subjective:  Patient ID: George Peterson, male    DOB: Apr 03, 1985  Age: 35 y.o. MRN: [id number]  CC:  Chief Complaint  Patient presents with  . Tachycardia    X 1 year, feels like heart beat is fast, only happens ever once in a while    HPI George Peterson presents for intermittent palpitations.  He states he has had these relief for at least a year.  Usually has episodes about 3-4 times per month.  No clear triggers.  He has sometimes sudden awareness that his heart rate is elevated and usually when palpated around 100.  No sense of irregularity other than possible premature beats.  No lightheadedness.  No syncope.  No chest pain.  He recently eliminated caffeine for couple weeks but did not see any improvement in symptoms.  No alcohol use.  Exercising without difficulty.  No syncope or dizziness with exercise.  Previous thyroid functions have been normal.  He does not have any persistent tachycardia, diarrhea, tremors, weight loss, etc. his regular medications are finasteride and tamsulosin and symptoms do not seem to correlate with those medications.  No family history of premature CAD in any first-degree relatives.  Prior EKGs reviewed most recently 05/01/2019 which was unremarkable  Past Medical History:  Diagnosis Date  . Allergy   . Kidney stones    multiple stones passed as child, had lithotripsy once  . Psoriasis     Past Surgical History:  Procedure Laterality Date  . EXTRACORPOREAL SHOCK WAVE LITHOTRIPSY Left 12/12/2019   Procedure: LEFT EXTRACORPOREAL SHOCK WAVE LITHOTRIPSY (ESWL);  Surgeon: Belva Agee, MD;  Location: [facility];  Service: Urology;  Laterality: Left;  . LITHOTRIPSY    . lymph node biospy     with splenic infection  . PILONIDAL CYST EXCISION  2008  . WISDOM TOOTH EXTRACTION      Family History  Problem Relation Age of Onset  . Arthritis Father   . Hypertension Mother     Social History    Socioeconomic History  . Marital status: Married    Spouse name: Not on file  . Number of children: Not on file  . Years of education: Not on file  . Highest education level: Not on file  Occupational History  . Not on file  Tobacco Use  . Smoking status: Never Smoker  . Smokeless tobacco: Never Used  Substance and Sexual Activity  . Alcohol use: Yes    Comment: occ, 2-4 drinks a few times per month  . Drug use: No  . Sexual activity: Not on file  Other Topics Concern  . Not on file  Social History Narrative   Work or School: full time Insurance account manager Situation: living with wife      Spiritual Beliefs: none      Lifestyle: goes to the gym 6 x per week and running, healthy diet            Social Determinants of Health   Financial Resource Strain: Not on file  Food Insecurity: Not on file  Transportation Needs: Not on file  Physical Activity: Not on file  Stress: Not on file  Social Connections: Not on file  Intimate Partner Violence: Not on file    Outpatient Medications Prior to Visit  Medication Sig Dispense Refill  . acetaminophen (TYLENOL) 500 MG tablet Take 500 mg by mouth every 6 (six) hours as needed for headache.     Marland Kitchen  betamethasone dipropionate 0.05 % cream Apply 1 application topically 2 (two) times daily as needed.     . DUOBRII 0.01-0.045 % LOTN Apply 1 application topically daily.    . finasteride (PROSCAR) 5 MG tablet Take 5 mg by mouth daily.    . fluticasone (CUTIVATE) 0.05 % cream Apply 1 application topically 2 (two) times daily. Apply to face or skin fold areas use 1 week on/off as needed.    . fluticasone (FLONASE) 50 MCG/ACT nasal spray Place 1 spray into both nostrils daily as needed for allergies or rhinitis.    . Multiple Vitamin (MULTIVITAMIN WITH MINERALS) TABS tablet Take 1 tablet by mouth daily.    . ondansetron (ZOFRAN ODT) 4 MG disintegrating tablet Take 1 tablet (4 mg total) by mouth every 8 (eight) hours as needed for nausea  or vomiting. 20 tablet 0  . oxyCODONE-acetaminophen (PERCOCET/ROXICET) 5-325 MG tablet Take 1 tablet by mouth every 6 (six) hours as needed for moderate pain.     . tamsulosin (FLOMAX) 0.4 MG CAPS capsule Take 0.4 mg by mouth daily.    Marland Kitchen triamcinolone cream (KENALOG) 0.1 % Apply topically 2 (two) times daily. (Patient taking differently: Apply 1 application topically 2 (two) times daily as needed (For psoriasis.).) 30 g 1   No facility-administered medications prior to visit.    No Known Allergies  ROS Review of Systems  Constitutional: Negative for appetite change, chills, fever and unexpected weight change.  Respiratory: Negative for shortness of breath.   Cardiovascular: Positive for palpitations. Negative for chest pain.  Neurological: Negative for dizziness and syncope.      Objective:    Physical Exam Vitals reviewed.  Constitutional:      Appearance: Normal appearance.  Cardiovascular:     Rate and Rhythm: Normal rate and regular rhythm.     Heart sounds: No murmur heard.   Pulmonary:     Effort: Pulmonary effort is normal.     Breath sounds: Normal breath sounds.  Musculoskeletal:     Right lower leg: No edema.     Left lower leg: No edema.  Neurological:     Mental Status: He is alert.     BP 120/64 (BP Location: Left Arm, Patient Position: Sitting, Cuff Size: Normal)   Pulse 75   Temp 98 F (36.7 C) (Oral)   Wt 159 lb 3.2 oz (72.2 kg)   SpO2 97%   BMI 24.21 kg/m  Wt Readings from Last 3 Encounters:  07/06/20 159 lb 3.2 oz (72.2 kg)  12/12/19 151 lb (68.5 kg)  12/02/19 157 lb 8 oz (71.4 kg)     Health Maintenance Due  Topic Date Due  . Hepatitis C Screening  Never done  . HIV Screening  Never done  . COVID-19 Vaccine (3 - Booster for Moderna series) 12/19/2019    There are no preventive care reminders to display for this patient.  Lab Results  Component Value Date   TSH 1.67 11/09/2018   Lab Results  Component Value Date   WBC 14.7 (H)  12/12/2019   HGB 14.1 12/12/2019   HCT 39.8 12/12/2019   MCV 85.2 12/12/2019   PLT 202 12/12/2019   Lab Results  Component Value Date   NA 136 12/12/2019   K 3.6 12/12/2019   CO2 26 12/12/2019   GLUCOSE 120 (H) 12/12/2019   BUN 9 12/12/2019   CREATININE 0.84 12/12/2019   BILITOT 1.2 04/30/2019   ALKPHOS 47 04/30/2019   AST 18 04/30/2019  ALT 22 04/30/2019   PROT 7.9 04/30/2019   ALBUMIN 4.8 04/30/2019   CALCIUM 9.0 12/12/2019   ANIONGAP 8 12/12/2019   GFR 96.20 11/09/2018   Lab Results  Component Value Date   CHOL 144 11/09/2018   Lab Results  Component Value Date   HDL 44.60 11/09/2018   Lab Results  Component Value Date   LDLCALC 85 11/09/2018   Lab Results  Component Value Date   TRIG 71.0 11/09/2018   Lab Results  Component Value Date   CHOLHDL 3 11/09/2018   No results found for: HGBA1C    Assessment & Plan:   Intermittent palpitations.  Palpated pulse and auscultated heart exam today reveals regular rhythm with rate around 65.  No irregularity noted.  Suspect from history he is probably having some PACs or PVCs.  He has never noted any extremely fast rate to suggest likely PSVT.  Does not have any worrisome symptoms such as dizziness, chest pains, syncope, etc.  -Continue to minimize caffeine -He plans to keep a diary to see if there is any correlation with his symptoms with lifestyle habits -We did discuss possible cardiac event monitoring but he would like to wait and observe at this time.  He has had prior EKGs which have been unremarkable and with normal exam we did not see indication to get repeat EKG today.  No orders of the defined types were placed in this encounter.   Follow-up: No follow-ups on file.    Evelena Peat, MD

## 2020-09-14 ENCOUNTER — Other Ambulatory Visit: Payer: Self-pay

## 2020-09-14 ENCOUNTER — Emergency Department (HOSPITAL_BASED_OUTPATIENT_CLINIC_OR_DEPARTMENT_OTHER): Payer: 59 | Admitting: Radiology

## 2020-09-14 ENCOUNTER — Emergency Department (HOSPITAL_BASED_OUTPATIENT_CLINIC_OR_DEPARTMENT_OTHER)
Admission: EM | Admit: 2020-09-14 | Discharge: 2020-09-15 | Disposition: A | Discharge status: Home / Self Care | Payer: 59 | Attending: Emergency Medicine | Admitting: Emergency Medicine

## 2020-09-14 DIAGNOSIS — F419 Anxiety disorder, unspecified: Secondary | ICD-10-CM | POA: Insufficient documentation

## 2020-09-14 DIAGNOSIS — R0602 Shortness of breath: Secondary | ICD-10-CM | POA: Insufficient documentation

## 2020-09-14 DIAGNOSIS — R002 Palpitations: Secondary | ICD-10-CM | POA: Insufficient documentation

## 2020-09-14 LAB — CBC WITH DIFFERENTIAL/PLATELET
Abs Immature Granulocytes: 0.03 K/uL (ref 0.00–0.07)
Basophils Absolute: 0.1 K/uL (ref 0.0–0.1)
Basophils Relative: 1 %
Eosinophils Absolute: 0.2 K/uL (ref 0.0–0.5)
Eosinophils Relative: 3 %
HCT: 39.9 % (ref 39.0–52.0)
Hemoglobin: 13.9 g/dL (ref 13.0–17.0)
Immature Granulocytes: 0 %
Lymphocytes Relative: 33 %
Lymphs Abs: 2.4 K/uL (ref 0.7–4.0)
MCH: 29.6 pg (ref 26.0–34.0)
MCHC: 34.8 g/dL (ref 30.0–36.0)
MCV: 85.1 fL (ref 80.0–100.0)
Monocytes Absolute: 0.5 K/uL (ref 0.1–1.0)
Monocytes Relative: 7 %
Neutro Abs: 3.9 K/uL (ref 1.7–7.7)
Neutrophils Relative %: 56 %
Platelets: 179 K/uL (ref 150–400)
RBC: 4.69 MIL/uL (ref 4.22–5.81)
RDW: 12.2 % (ref 11.5–15.5)
WBC: 7.1 K/uL (ref 4.0–10.5)
nRBC: 0 % (ref 0.0–0.2)

## 2020-09-14 LAB — D-DIMER, QUANTITATIVE: D-Dimer, Quant: <0.27 ug{FEU}/mL (ref 0.00–0.50)

## 2020-09-14 NOTE — ED Triage Notes (Signed)
Pt has had shob and feeling anxious since 1700.  Hx of same in the past but states he has never been "worked up" for it.  Recent travel to [place].

## 2020-09-14 NOTE — ED Provider Notes (Addendum)
[facility] EMERGENCY DEPT Provider Note   CSN: [id number] Arrival date & time: 09/14/20  2145     History Chief Complaint  Patient presents with   Shortness of Breath    George Peterson is a 35 y.o. male.  Patient here with palpitations, sensation of heart skipping beats, heart pounding, feeling anxious and short of breath.  Symptoms started while he was on the computer about 5 PM.  Has gradually improved and waxes and wanes in severity.  Similar symptoms in the past without clear diagnosis.  Denies frank chest pain.  Denies cough or fever.  Denies any leg pain or leg swelling.  No abdominal pain, nausea or vomiting.  Recent car travel to [place].  No history of ACS or DVT PE. Denies any excessive alcohol or caffeine intake.  No history of thyroid issues.  No history of early family cardiac death. He denies any prescription medications.  The history is provided by the patient.  Shortness of Breath Associated symptoms: no abdominal pain, no chest pain, no fever, no headaches, no rash and no vomiting       Past Medical History:  Diagnosis Date   Allergy    Kidney stones    multiple stones passed as child, had lithotripsy once   Psoriasis     Patient Active Problem List   Diagnosis Date Noted   Psoriasis 09/07/2018   Nephrolithiasis 09/07/2018   Exposure to COVID-19 virus 09/07/2018    Past Surgical History:  Procedure Laterality Date   EXTRACORPOREAL SHOCK WAVE LITHOTRIPSY Left 12/12/2019   Procedure: LEFT EXTRACORPOREAL SHOCK WAVE LITHOTRIPSY (ESWL);  Surgeon: Belva Agee, MD;  Location: [facility];  Service: Urology;  Laterality: Left;   LITHOTRIPSY     lymph node biospy     with splenic infection   PILONIDAL CYST EXCISION  2008   WISDOM TOOTH EXTRACTION         Family History  Problem Relation Age of Onset   Arthritis Father    Hypertension Mother     Social History   Tobacco Use   Smoking status: Never   Smokeless  tobacco: Never  Substance Use Topics   Alcohol use: Yes    Comment: occ, 2-4 drinks a few times per month   Drug use: No    Home Medications Prior to Admission medications   Medication Sig Start Date End Date Taking? Authorizing Provider  acetaminophen (TYLENOL) 500 MG tablet Take 500 mg by mouth every 6 (six) hours as needed for headache.     [provider]  betamethasone dipropionate 0.05 % cream Apply 1 application topically 2 (two) times daily as needed.  04/15/19   [provider]  DUOBRII 0.01-0.045 % LOTN Apply 1 application topically daily. 04/15/19   [provider]  finasteride (PROSCAR) 5 MG tablet Take 5 mg by mouth daily.    [provider]  fluticasone (CUTIVATE) 0.05 % cream Apply 1 application topically 2 (two) times daily. Apply to face or skin fold areas use 1 week on/off as needed.    [provider]  fluticasone (FLONASE) 50 MCG/ACT nasal spray Place 1 spray into both nostrils daily as needed for allergies or rhinitis.    [provider]  Multiple Vitamin (MULTIVITAMIN WITH MINERALS) TABS tablet Take 1 tablet by mouth daily.    [provider]  ondansetron (ZOFRAN ODT) 4 MG disintegrating tablet Take 1 tablet (4 mg total) by mouth every 8 (eight) hours as needed for nausea  or vomiting. 12/12/19   Henderly, Britni A, PA-C  oxyCODONE-acetaminophen (PERCOCET/ROXICET) 5-325 MG tablet Take 1 tablet by mouth every 6 (six) hours as needed for moderate pain.  12/05/19   [provider]  tamsulosin (FLOMAX) 0.4 MG CAPS capsule Take 0.4 mg by mouth daily. 11/29/19   [provider]  triamcinolone cream (KENALOG) 0.1 % Apply topically 2 (two) times daily. Patient taking differently: Apply 1 application topically 2 (two) times daily as needed (For psoriasis.). 08/21/12   Terressa Koyanagi, DO    Allergies    Patient has no known allergies.  Review of Systems   Review of Systems  Constitutional:  Negative for  activity change, appetite change and fever.  HENT:  Negative for congestion and rhinorrhea.   Eyes:  Negative for visual disturbance.  Respiratory:  Positive for chest tightness and shortness of breath.   Cardiovascular:  Positive for palpitations. Negative for chest pain.  Gastrointestinal:  Negative for abdominal pain, nausea and vomiting.  Genitourinary:  Negative for dysuria and hematuria.  Musculoskeletal:  Negative for arthralgias and myalgias.  Skin:  Negative for rash.  Neurological:  Negative for dizziness, weakness, light-headedness and headaches.   all other systems are negative except as noted in the HPI and PMH.   Physical Exam Updated Vital Signs BP (!) 143/98 (BP Location: Right Arm)   Pulse 65   Temp 99 F (37.2 C) (Oral)   Resp 13   Ht 5\' 8"  (1.727 m)   Wt 72.6 kg   SpO2 97%   BMI 24.33 kg/m   Physical Exam Vitals and nursing note reviewed.  Constitutional:      General: He is not in acute distress.    Appearance: He is well-developed.  HENT:     Head: Normocephalic and atraumatic.     Mouth/Throat:     Pharynx: No oropharyngeal exudate.  Eyes:     Conjunctiva/sclera: Conjunctivae normal.     Pupils: Pupils are equal, round, and reactive to light.  Neck:     Comments: No meningismus. Cardiovascular:     Rate and Rhythm: Normal rate and regular rhythm.     Heart sounds: Normal heart sounds. No murmur heard. Pulmonary:     Effort: Pulmonary effort is normal. No respiratory distress.     Breath sounds: Normal breath sounds.  Abdominal:     Palpations: Abdomen is soft.     Tenderness: There is no abdominal tenderness. There is no guarding or rebound.  Musculoskeletal:        General: No tenderness. Normal range of motion.     Cervical back: Normal range of motion and neck supple.  Skin:    General: Skin is warm.  Neurological:     Mental Status: He is alert and oriented to person, place, and time.     Cranial Nerves: No cranial nerve deficit.      Motor: No abnormal muscle tone.     Coordination: Coordination normal.     Comments: No ataxia on finger to nose bilaterally. No pronator drift. 5/5 strength throughout. CN 2-12 intact.Equal grip strength. Sensation intact.   Psychiatric:        Behavior: Behavior normal.    ED Results / Procedures / Treatments   Labs (all labs ordered are listed, but only abnormal results are displayed) Labs Reviewed  BASIC METABOLIC PANEL - Abnormal; Notable for the following components:      Result Value   Glucose, Bld 100 (*)    All other  components within normal limits  CBC WITH DIFFERENTIAL/PLATELET  D-DIMER, QUANTITATIVE  TSH  TROPONIN I (HIGH SENSITIVITY)    EKG EKG Interpretation  Date/Time:  Monday September 14 2020 23:15:54 EDT Ventricular Rate:  62 PR Interval:  108 QRS Duration: 94 QT Interval:  390 QTC Calculation: 396 R Axis:   49 Text Interpretation: Sinus rhythm Short PR interval No significant change was found Confirmed by Glynn Octave [phone]) on 09/14/2020 11:23:56 PM  Radiology DG Chest 2 View  Result Date: 09/14/2020 CLINICAL DATA:  Shortness of breath EXAM: CHEST - 2 VIEW COMPARISON:  04/30/2019 FINDINGS: The heart size and mediastinal contours are within normal limits. Both lungs are clear. The visualized skeletal structures are unremarkable. IMPRESSION: No active cardiopulmonary disease. Electronically Signed   By: Jasmine Pang M.D.   On: 09/14/2020 22:51    Procedures Procedures   Medications Ordered in ED Medications - No data to display  ED Course  I have reviewed the triage vital signs and the nursing notes.  Pertinent labs & imaging results that were available during my care of the patient were reviewed by me and considered in my medical decision making (see chart for details).    MDM Rules/Calculators/A&P                         Palpitations, short of breath, sensation of heart skipping beats since 5 PM.  EKG is sinus rhythm.  Patient appears well.   EKG is sinus rhythm.  Chest x-ray is negative.  Electrolytes are reassuring.  Troponin and D-dimer are negative.  Low suspicion for ACS or PE.  TSH is sent and pending. Patient aware he can check this result online tomorrow.  Advised patient to follow-up with cardiology for possible Holter monitoring.  Return to the ED with worsening chest pain especially with exertion or associated shortness of breath, nausea, vomiting, sweating or any concerns. Advised to trial reduced caffeine intake. Final Clinical Impression(s) / ED Diagnoses Final diagnoses:  Palpitations    Rx / DC Orders ED Discharge Orders     None        Zamani Crocker, Jeannett Senior, MD 09/15/20 0151    Glynn Octave, MD 09/15/20 [phone]

## 2020-09-15 LAB — BASIC METABOLIC PANEL
Anion gap: 9 (ref 5–15)
BUN: 9 mg/dL (ref 6–20)
CO2: 28 mmol/L (ref 22–32)
Calcium: 9.7 mg/dL (ref 8.9–10.3)
Chloride: 104 mmol/L (ref 98–111)
Creatinine, Ser: 0.76 mg/dL (ref 0.61–1.24)
GFR, Estimated: >60 mL/min (ref >60)
Glucose, Bld: 100 mg/dL — ABNORMAL HIGH (ref 70–99)
Potassium: 3.8 mmol/L (ref 3.5–5.1)
Sodium: 141 mmol/L (ref 135–145)

## 2020-09-15 LAB — TSH: TSH: 1.863 u[IU]/mL (ref 0.350–4.500)

## 2020-09-15 LAB — TROPONIN I (HIGH SENSITIVITY): Troponin I (High Sensitivity): <2 ng/L (ref <18)

## 2020-09-15 NOTE — Discharge Instructions (Addendum)
Your testing is reassuring.  There is no evidence of heart attack or blood clot in the lung.  Follow-up with your primary doctor or cardiologist for echocardiogram and Holter monitor.  You may check your thyroid function on line tomorrow.  Return to the ED if you develop worsening chest pain especially with exertion, associated shortness of breath, nausea, vomiting, sweating, any other concerns.

## 2020-11-16 ENCOUNTER — Ambulatory Visit (INDEPENDENT_AMBULATORY_CARE_PROVIDER_SITE_OTHER): Payer: 59 | Admitting: Family Medicine

## 2020-11-16 ENCOUNTER — Encounter: Payer: Self-pay | Admitting: Family Medicine

## 2020-11-16 ENCOUNTER — Other Ambulatory Visit: Payer: Self-pay

## 2020-11-16 VITALS — BP 100/78 | HR 77 | Temp 97.7°F | Ht 68.0 in | Wt 165.6 lb

## 2020-11-16 DIAGNOSIS — R002 Palpitations: Secondary | ICD-10-CM | POA: Diagnosis not present

## 2020-11-16 DIAGNOSIS — Z0000 Encounter for general adult medical examination without abnormal findings: Secondary | ICD-10-CM | POA: Diagnosis not present

## 2020-11-16 DIAGNOSIS — Z13220 Encounter for screening for lipoid disorders: Secondary | ICD-10-CM

## 2020-11-16 LAB — COMPREHENSIVE METABOLIC PANEL
ALT: 25 U/L (ref 0–53)
AST: 22 U/L (ref 0–37)
Albumin: 4.6 g/dL (ref 3.5–5.2)
Alkaline Phosphatase: 48 U/L (ref 39–117)
BUN: 9 mg/dL (ref 6–23)
CO2: 27 mEq/L (ref 19–32)
Calcium: 9.5 mg/dL (ref 8.4–10.5)
Chloride: 105 mEq/L (ref 96–112)
Creatinine, Ser: 0.86 mg/dL (ref 0.40–1.50)
GFR: 112.9 mL/min (ref >60.00)
Glucose, Bld: 87 mg/dL (ref 70–99)
Potassium: 3.8 mEq/L (ref 3.5–5.1)
Sodium: 140 mEq/L (ref 135–145)
Total Bilirubin: 0.6 mg/dL (ref 0.2–1.2)
Total Protein: 6.9 g/dL (ref 6.0–8.3)

## 2020-11-16 LAB — LIPID PANEL
Cholesterol: 125 mg/dL (ref 0–200)
HDL: 44.4 mg/dL (ref >39.00)
LDL Cholesterol: 67 mg/dL (ref 0–99)
NonHDL: 80.29
Total CHOL/HDL Ratio: 3
Triglycerides: 64 mg/dL (ref 0.0–149.0)
VLDL: 12.8 mg/dL (ref 0.0–40.0)

## 2020-11-16 LAB — VITAMIN D 25 HYDROXY (VIT D DEFICIENCY, FRACTURES): VITD: 28.58 ng/mL — ABNORMAL LOW (ref 30.00–100.00)

## 2020-11-16 LAB — VITAMIN B12: Vitamin B-12: 562 pg/mL (ref 211–911)

## 2020-11-16 LAB — FERRITIN: Ferritin: 75.4 ng/mL (ref 22.0–322.0)

## 2020-11-16 NOTE — Progress Notes (Signed)
George Peterson DOB: 04/22/1985 Encounter date: 11/16/2020  This is a 35 y.o. male who presents for complete physical   History of present illness/Additional concerns:  Irregular heart beat: has had this off an on for awhile. Combination of quick heart beat; feels like it is skipping/fast momentarily. Doesn't seem to be triggered by anything. Just happening randomly. No exercise association; no caffeine association. Beats quickly for a minute and then feels like it skips a beat. Went to ER on 6/27 because that day it happened longer than normal (in past would occur for a couple of minutes) - happened again multiple times. Sort of shortness of breath associated - not bad just feels like he isn't quite getting full breath in. Never feeling faint, light headed. No chest pains associated. Has noticed these at least months; maybe a year.   Probably happens about once a week. 2 weeks prior to that. Each instance he has felt has been less than a minute.  Headaches - occasional.   Has eye eval regularly; wears contacts. Last visit a couple of months ago.   Kidney stones last year - has been ok with this since last episode.   Past Medical History:  Diagnosis Date   Allergy    Kidney stones    multiple stones passed as child, had lithotripsy once   Psoriasis    Past Surgical History:  Procedure Laterality Date   EXTRACORPOREAL SHOCK WAVE LITHOTRIPSY Left 12/12/2019   Procedure: LEFT EXTRACORPOREAL SHOCK WAVE LITHOTRIPSY (ESWL);  Surgeon: Belva Agee, MD;  Location: [facility];  Service: Urology;  Laterality: Left;   LITHOTRIPSY     lymph node biospy     with splenic infection   PILONIDAL CYST EXCISION  2008   WISDOM TOOTH EXTRACTION     No Known Allergies Current Meds  Medication Sig   acetaminophen (TYLENOL) 500 MG tablet Take 500 mg by mouth every 6 (six) hours as needed for headache.    betamethasone dipropionate 0.05 % cream Apply 1 application topically 2 (two)  times daily as needed.    DUOBRII 0.01-0.045 % LOTN Apply 1 application topically daily.   finasteride (PROSCAR) 5 MG tablet Take 5 mg by mouth daily.   fluticasone (CUTIVATE) 0.05 % cream Apply 1 application topically 2 (two) times daily. Apply to face or skin fold areas use 1 week on/off as needed.   fluticasone (FLONASE) 50 MCG/ACT nasal spray Place 1 spray into both nostrils daily as needed for allergies or rhinitis.   levocetirizine (XYZAL) 5 MG tablet Take 5 mg by mouth daily.   Multiple Vitamin (MULTIVITAMIN WITH MINERALS) TABS tablet Take 1 tablet by mouth daily.   triamcinolone cream (KENALOG) 0.1 % Apply topically 2 (two) times daily. (Patient taking differently: Apply 1 application topically 2 (two) times daily as needed (For psoriasis.).)   Social History   Tobacco Use   Smoking status: Never   Smokeless tobacco: Never  Substance Use Topics   Alcohol use: Yes    Comment: occ, 2-4 drinks a few times per month   Family History  Problem Relation Age of Onset   Hypertension Mother    Arthritis Father      Review of Systems  Constitutional:  Negative for activity change, appetite change, chills, fatigue, fever and unexpected weight change.  HENT:  Negative for congestion, ear pain, hearing loss, sinus pressure, sinus pain, sore throat and trouble swallowing.   Eyes:  Negative for pain and visual disturbance.  Respiratory:  Negative  for cough, chest tightness, shortness of breath and wheezing.   Cardiovascular:  Positive for palpitations. Negative for chest pain and leg swelling.  Gastrointestinal:  Negative for abdominal distention, abdominal pain, blood in stool, constipation, diarrhea, nausea and vomiting.  Genitourinary:  Negative for decreased urine volume, difficulty urinating, dysuria, penile pain and testicular pain.  Musculoskeletal:  Negative for arthralgias, back pain and joint swelling.  Skin:  Negative for rash.  Neurological:  Negative for dizziness, weakness,  numbness and headaches.  Hematological:  Negative for adenopathy. Does not bruise/bleed easily.  Psychiatric/Behavioral:  Negative for agitation, sleep disturbance and suicidal ideas. The patient is not nervous/anxious.    CBC:  Lab Results  Component Value Date   WBC 7.1 09/14/2020   HGB 13.9 09/14/2020   HCT 39.9 09/14/2020   MCH 29.6 09/14/2020   MCHC 34.8 09/14/2020   RDW 12.2 09/14/2020   PLT 179 09/14/2020   CMP: Lab Results  Component Value Date   NA 140 11/16/2020   K 3.8 11/16/2020   CL 105 11/16/2020   CO2 27 11/16/2020   ANIONGAP 9 09/14/2020   GLUCOSE 87 11/16/2020   BUN 9 11/16/2020   CREATININE 0.86 11/16/2020   GFRAA >60 12/12/2019   CALCIUM 9.5 11/16/2020   PROT 6.9 11/16/2020   BILITOT 0.6 11/16/2020   ALKPHOS 48 11/16/2020   ALT 25 11/16/2020   AST 22 11/16/2020   LIPID: Lab Results  Component Value Date   CHOL 125 11/16/2020   TRIG 64.0 11/16/2020   HDL 44.40 11/16/2020   LDLCALC 67 11/16/2020    Objective:  BP 100/78 (BP Location: Left Arm, Patient Position: Sitting, Cuff Size: Normal)   Pulse 77   Temp 97.7 F (36.5 C) (Oral)   Ht 5\' 8"  (1.727 m)   Wt 165 lb 9.6 oz (75.1 kg)   SpO2 98%   BMI 25.18 kg/m   Weight: 165 lb 9.6 oz (75.1 kg)   BP Readings from Last 3 Encounters:  11/16/20 100/78  09/15/20 128/73  07/06/20 120/64   Wt Readings from Last 3 Encounters:  11/16/20 165 lb 9.6 oz (75.1 kg)  09/14/20 160 lb (72.6 kg)  07/06/20 159 lb 3.2 oz (72.2 kg)    Physical Exam Constitutional:      General: He is not in acute distress.    Appearance: He is well-developed.  HENT:     Head: Normocephalic and atraumatic.     Right Ear: External ear normal.     Left Ear: External ear normal.     Nose: Nose normal.     Mouth/Throat:     Pharynx: No oropharyngeal exudate.  Eyes:     Conjunctiva/sclera: Conjunctivae normal.     Pupils: Pupils are equal, round, and reactive to light.  Neck:     Thyroid: No thyromegaly.   Cardiovascular:     Rate and Rhythm: Normal rate and regular rhythm.     Heart sounds: Normal heart sounds. No murmur heard.   No friction rub. No gallop.  Pulmonary:     Effort: Pulmonary effort is normal. No respiratory distress.     Breath sounds: Normal breath sounds. No stridor. No wheezing or rales.  Abdominal:     General: Bowel sounds are normal.     Palpations: Abdomen is soft.     Hernia: There is no hernia in the left inguinal area or right inguinal area.  Genitourinary:    Testes: Normal.     Epididymis:     Right:  Normal.     Left: Normal.  Musculoskeletal:        General: Normal range of motion.     Cervical back: Neck supple.  Lymphadenopathy:     Lower Body: No right inguinal adenopathy. No left inguinal adenopathy.  Skin:    General: Skin is warm and dry.  Neurological:     Mental Status: He is alert and oriented to person, place, and time.  Psychiatric:        Behavior: Behavior normal.        Thought Content: Thought content normal.        Judgment: Judgment normal.    Assessment/Plan: Health Maintenance Due  Topic Date Due   HIV Screening  Never done   Hepatitis C Screening  Never done   INFLUENZA VACCINE  10/19/2020   Health Maintenance reviewed - up to date.  1. Preventative health care Keep up with regular exercise, healthy eating.   2. Palpitations Exam stable; ER records reviewed and these were stable as well. Will get echo and order event monitor but suspect benign cause. - ECHOCARDIOGRAM COMPLETE; Future - Cardiac event monitor; Future - Vitamin B12; Future - Magnesium, RBC; Future - VITAMIN D 25 Hydroxy (Vit-D Deficiency, Fractures); Future - Comprehensive metabolic panel; Future - Ferritin; Future - Ferritin - Comprehensive metabolic panel - VITAMIN D 25 Hydroxy (Vit-D Deficiency, Fractures) - Magnesium, RBC - Vitamin B12  3. Lipid screening - Lipid panel; Future - Lipid panel   Return in about 1 year (around 11/16/2021) for  physical exam.  Theodis Shove, MD

## 2020-11-18 LAB — MAGNESIUM, RBC: Magnesium RBC: 5 mg/dL (ref 4.0–6.4)

## 2020-11-23 ENCOUNTER — Ambulatory Visit (INDEPENDENT_AMBULATORY_CARE_PROVIDER_SITE_OTHER): Payer: 59

## 2020-11-23 DIAGNOSIS — R002 Palpitations: Secondary | ICD-10-CM

## 2020-12-14 ENCOUNTER — Other Ambulatory Visit: Payer: Self-pay

## 2020-12-14 ENCOUNTER — Ambulatory Visit (HOSPITAL_COMMUNITY): Payer: 59 | Attending: Cardiology

## 2020-12-14 DIAGNOSIS — R002 Palpitations: Secondary | ICD-10-CM | POA: Diagnosis present

## 2020-12-14 LAB — ECHOCARDIOGRAM COMPLETE
Area-P 1/2: 3.39 cm2
S' Lateral: 2.6 cm

## 2021-04-19 ENCOUNTER — Encounter: Payer: Self-pay | Admitting: Family Medicine

## 2021-04-26 ENCOUNTER — Ambulatory Visit: Payer: 59 | Admitting: Family Medicine

## 2021-04-26 ENCOUNTER — Encounter: Payer: Self-pay | Admitting: Family Medicine

## 2021-04-26 VITALS — BP 102/82 | HR 72 | Temp 98.2°F | Ht 68.0 in | Wt 169.6 lb

## 2021-04-26 DIAGNOSIS — N50811 Right testicular pain: Secondary | ICD-10-CM | POA: Diagnosis not present

## 2021-04-26 DIAGNOSIS — R110 Nausea: Secondary | ICD-10-CM

## 2021-04-26 LAB — CBC WITH DIFFERENTIAL/PLATELET
Basophils Absolute: 0.1 10*3/uL (ref 0.0–0.1)
Basophils Relative: 1 % (ref 0.0–3.0)
Eosinophils Absolute: 0.2 10*3/uL (ref 0.0–0.7)
Eosinophils Relative: 2.9 % (ref 0.0–5.0)
HCT: 42.7 % (ref 39.0–52.0)
Hemoglobin: 14.7 g/dL (ref 13.0–17.0)
Lymphocytes Relative: 27.9 % (ref 12.0–46.0)
Lymphs Abs: 2 10*3/uL (ref 0.7–4.0)
MCHC: 34.4 g/dL (ref 30.0–36.0)
MCV: 83.3 fl (ref 78.0–100.0)
Monocytes Absolute: 0.5 10*3/uL (ref 0.1–1.0)
Monocytes Relative: 6.8 % (ref 3.0–12.0)
Neutro Abs: 4.4 10*3/uL (ref 1.4–7.7)
Neutrophils Relative %: 61.4 % (ref 43.0–77.0)
Platelets: 212 10*3/uL (ref 150.0–400.0)
RBC: 5.13 Mil/uL (ref 4.22–5.81)
RDW: 13 % (ref 11.5–15.5)
WBC: 7.1 10*3/uL (ref 4.0–10.5)

## 2021-04-26 LAB — PSA: PSA: 0.69 ng/mL (ref 0.10–4.00)

## 2021-04-26 LAB — COMPREHENSIVE METABOLIC PANEL
ALT: 35 U/L (ref 0–53)
AST: 27 U/L (ref 0–37)
Albumin: 4.8 g/dL (ref 3.5–5.2)
Alkaline Phosphatase: 51 U/L (ref 39–117)
BUN: 8 mg/dL (ref 6–23)
CO2: 31 mEq/L (ref 19–32)
Calcium: 9.8 mg/dL (ref 8.4–10.5)
Chloride: 103 mEq/L (ref 96–112)
Creatinine, Ser: 0.82 mg/dL (ref 0.40–1.50)
GFR: 114.18 mL/min (ref >60.00)
Glucose, Bld: 101 mg/dL — ABNORMAL HIGH (ref 70–99)
Potassium: 4.2 mEq/L (ref 3.5–5.1)
Sodium: 139 mEq/L (ref 135–145)
Total Bilirubin: 0.9 mg/dL (ref 0.2–1.2)
Total Protein: 7.1 g/dL (ref 6.0–8.3)

## 2021-04-26 LAB — LIPASE: Lipase: 18 U/L (ref 11.0–59.0)

## 2021-04-26 NOTE — Progress Notes (Signed)
George Peterson DOB: 1985/07/12 Encounter date: 04/26/2021  This is a 36 y.o. male who presents with Chief Complaint  Patient presents with   Nausea    Patient complains of intermittent nausea x2 weeks   Groin Pain    Patient complains of right testicular pain intermittently x2-3 weeks, denies dysuria    History of present illness: Noted both issues over last few weeks- intermittent nausea. No pattern. Not every day, not every morning.sometimes right after waking up, sometimes in afternoon. Not related to eating. Doesn't go away after eating. No stomach pain really - maybe just one day where he felt more bloated/uncomfortable. Feels over-stuffed. No vomiting. Bowel movements usually daily. No change in color/size.   Pain in right testicle. Nothing he can feel is abnormal. Not constant pain, more increased sensitivity - like pant leg touching him feels sudden sharper pain and then painful numbness for awhile after (not hours).keeps sensitivity. Left side seems ok. Groin muscles seem ok. No swelling. Mild radiation around testicle,but not much further. No problems with urinating. No pain with ejaculation/intercourse. Doesn't feel like testicular discomfort is related to nausea.   No Known Allergies Current Meds  Medication Sig   acetaminophen (TYLENOL) 500 MG tablet Take 500 mg by mouth every 6 (six) hours as needed for headache.    betamethasone dipropionate 0.05 % cream Apply 1 application topically 2 (two) times daily as needed.    DUOBRII 0.01-0.045 % LOTN Apply 1 application topically daily.   finasteride (PROSCAR) 5 MG tablet Take 5 mg by mouth daily.   fluticasone (CUTIVATE) 0.05 % cream Apply 1 application topically 2 (two) times daily. Apply to face or skin fold areas use 1 week on/off as needed.   fluticasone (FLONASE) 50 MCG/ACT nasal spray Place 1 spray into both nostrils daily as needed for allergies or rhinitis.   levocetirizine (XYZAL) 5 MG tablet Take 5 mg by mouth daily.    Multiple Vitamin (MULTIVITAMIN WITH MINERALS) TABS tablet Take 1 tablet by mouth daily.   triamcinolone cream (KENALOG) 0.1 % Apply topically 2 (two) times daily. (Patient taking differently: Apply 1 application topically 2 (two) times daily as needed (For psoriasis.).)    Review of Systems  Constitutional:  Negative for chills, fatigue and fever.  Respiratory:  Negative for cough, chest tightness, shortness of breath and wheezing.   Cardiovascular:  Negative for chest pain, palpitations and leg swelling.  Gastrointestinal:  Positive for abdominal distention and nausea. Negative for blood in stool, constipation, diarrhea and vomiting. Abdominal pain: discomfort. Genitourinary:  Positive for testicular pain. Negative for difficulty urinating, dysuria, frequency, hematuria, penile pain, penile swelling, scrotal swelling and urgency.   Objective:  BP 102/82 (BP Location: Left Arm, Patient Position: Sitting, Cuff Size: Large)    Pulse 72    Temp 98.2 F (36.8 C) (Oral)    Ht 5\' 8"  (1.727 m)    Wt 169 lb 9.6 oz (76.9 kg)    SpO2 99%    BMI 25.79 kg/m   Weight: 169 lb 9.6 oz (76.9 kg)   BP Readings from Last 3 Encounters:  04/26/21 102/82  11/16/20 100/78  09/15/20 128/73   Wt Readings from Last 3 Encounters:  04/26/21 169 lb 9.6 oz (76.9 kg)  11/16/20 165 lb 9.6 oz (75.1 kg)  09/14/20 160 lb (72.6 kg)    Physical Exam Constitutional:      General: He is not in acute distress.    Appearance: He is well-developed.  Cardiovascular:     Rate  and Rhythm: Normal rate and regular rhythm.     Heart sounds: Normal heart sounds. No murmur heard.   No friction rub.  Pulmonary:     Effort: Pulmonary effort is normal. No respiratory distress.     Breath sounds: Normal breath sounds. No wheezing or rales.  Abdominal:     General: Abdomen is flat. Bowel sounds are normal.     Palpations: Abdomen is soft.     Tenderness: There is no abdominal tenderness.  Genitourinary:    Penis: Normal and  circumcised.      Comments: Right testicle slightly larger than left, slightly more firm. No discrete nodules appreciated. No tenderness with palpation but some aching after exam. No inguinal hernia.  Musculoskeletal:     Right lower leg: No edema.     Left lower leg: No edema.  Neurological:     Mental Status: He is alert and oriented to person, place, and time.  Psychiatric:        Behavior: Behavior normal.    Assessment/Plan  1. Nausea Start with bloodwork; consider further evaluation pending this result.  - CBC with Differential/Platelet; Future - Comprehensive metabolic panel; Future - Lipase; Future  2. Pain in right testicle Start with [place]; consider further eval pending this.  - US Scrotum; Future - PSA; Future - Urinalysis with Culture Reflex; Future   Return for pending lab or imaging results. Patient states mom had abnormal colonoscopy and he was recommended for earlier screening.     Micheline Rough, MD

## 2021-04-27 LAB — URINALYSIS W MICROSCOPIC + REFLEX CULTURE
Bacteria, UA: NONE SEEN /HPF
Bilirubin Urine: NEGATIVE
Glucose, UA: NEGATIVE
Hgb urine dipstick: NEGATIVE
Hyaline Cast: NONE SEEN /LPF
Ketones, ur: NEGATIVE
Leukocyte Esterase: NEGATIVE
Nitrites, Initial: NEGATIVE
Protein, ur: NEGATIVE
RBC / HPF: NONE SEEN /HPF (ref 0–2)
Specific Gravity, Urine: 1.016 (ref 1.001–1.035)
Squamous Epithelial / HPF: NONE SEEN /HPF (ref <=5)
WBC, UA: NONE SEEN /HPF (ref 0–5)
pH: 8 (ref 5.0–8.0)

## 2021-04-27 LAB — NO CULTURE INDICATED

## 2021-04-30 ENCOUNTER — Ambulatory Visit
Admission: RE | Admit: 2021-04-30 | Discharge: 2021-04-30 | Disposition: A | Discharge status: Home / Self Care | Payer: 59 | Source: Ambulatory Visit | Attending: Family Medicine | Admitting: Family Medicine

## 2021-04-30 ENCOUNTER — Other Ambulatory Visit: Payer: Self-pay

## 2021-04-30 DIAGNOSIS — N50811 Right testicular pain: Secondary | ICD-10-CM

## 2021-05-06 ENCOUNTER — Telehealth: Payer: Self-pay | Admitting: Family Medicine

## 2021-05-06 ENCOUNTER — Other Ambulatory Visit: Payer: Self-pay | Admitting: *Deleted

## 2021-05-06 DIAGNOSIS — N50811 Right testicular pain: Secondary | ICD-10-CM

## 2021-05-06 NOTE — Telephone Encounter (Signed)
See results note. 

## 2021-05-06 NOTE — Telephone Encounter (Signed)
Patient called in requesting to speak with Wendie Simmer.  Patient could be contacted at [phone].  Please advise.

## 2021-09-10 ENCOUNTER — Telehealth: Payer: Self-pay | Admitting: Family Medicine

## 2021-10-23 ENCOUNTER — Telehealth: Payer: 59 | Admitting: Nurse Practitioner

## 2021-10-23 DIAGNOSIS — U071 COVID-19: Secondary | ICD-10-CM | POA: Diagnosis not present

## 2021-10-23 MED ORDER — MOLNUPIRAVIR EUA 200MG CAPSULE: 4.0000 | ORAL_CAPSULE | Freq: Two times a day (BID) | ORAL | 0 refills | Status: AC

## 2021-10-23 NOTE — Patient Instructions (Signed)
1. Take meds as prescribed 2. Use a cool mist humidifier especially during the winter months and when heat has been humid. 3. Use saline nose sprays frequently 4. Saline irrigations of the nose can be very helpful if done frequently.  * 4X daily for 1 week*  * Use of a nettie pot can be helpful with this. Follow directions with this* 5. Drink plenty of fluids 6. Keep thermostat turn down low 7.For any cough or congestion- robitussin DM 8. For fever or aces or pains- take tylenol or ibuprofen appropriate for age and weight.  * for fevers greater than 101 orally you may alternate ibuprofen and tylenol every  3 hours.    

## 2021-10-23 NOTE — Progress Notes (Signed)
Virtual Visit Consent   George Peterson, you are scheduled for a virtual visit with Mary-Margaret Daphine Deutscher, FNP, a [facility] provider, today.     Just as with appointments in the office, your consent must be obtained to participate.  Your consent will be active for this visit and any virtual visit you may have with one of our providers in the next 365 days.     If you have a MyChart account, a copy of this consent can be sent to you electronically.  All virtual visits are billed to your insurance company just like a traditional visit in the office.    As this is a virtual visit, video technology does not allow for your provider to perform a traditional examination.  This may limit your provider's ability to fully assess your condition.  If your provider identifies any concerns that need to be evaluated in person or the need to arrange testing (such as labs, EKG, etc.), we will make arrangements to do so.     Although advances in technology are sophisticated, we cannot ensure that it will always work on either your end or our end.  If the connection with a video visit is poor, the visit may have to be switched to a telephone visit.  With either a video or telephone visit, we are not always able to ensure that we have a secure connection.     I need to obtain your verbal consent now.   Are you willing to proceed with your visit today? YES   George Peterson has provided verbal consent on 10/23/2021 for a virtual visit (video or telephone).   Mary-Margaret Daphine Deutscher, FNP   Date: 10/23/2021 3:23 PM   Virtual Visit via Video Note   I, Mary-Margaret Daphine Deutscher, connected with George Peterson ([id number], 1985-12-21) on 10/23/21 at  3:30 PM EDT by a video-enabled telemedicine application and verified that I am speaking with the correct person using two identifiers.  Location: Patient: Virtual Visit Location Patient: Home Provider: Virtual Visit Location Provider: Mobile   I discussed the limitations of  evaluation and management by telemedicine and the availability of in person appointments. The patient expressed understanding and agreed to proceed.    History of Present Illness: George Peterson is a 36 y.o. who identifies as a male who was assigned male at birth, and is being seen today for covid positive.   HPI: URI  This is a new problem. The current episode started in the past 7 days (THursday). The problem has been gradually worsening. The maximum temperature recorded prior to his arrival was 100.4 - 100.9 F. The fever has been present for 1 to 2 days. Associated symptoms include congestion, coughing, headaches, rhinorrhea and a sore throat. He has tried acetaminophen for the symptoms. The treatment provided mild relief.   Tested positive for covid this morning.  Review of Systems  HENT:  Positive for congestion, rhinorrhea and sore throat.   Respiratory:  Positive for cough.   Neurological:  Positive for headaches.    Problems:  Patient Active Problem List   Diagnosis Date Noted   Psoriasis 09/07/2018   Nephrolithiasis 09/07/2018   Exposure to COVID-19 virus 09/07/2018    Allergies: No Known Allergies Medications:  Current Outpatient Medications:    acetaminophen (TYLENOL) 500 MG tablet, Take 500 mg by mouth every 6 (six) hours as needed for headache. , Disp: , Rfl:    betamethasone dipropionate 0.05 % cream, Apply 1 application topically 2 (two) times daily as  needed. , Disp: , Rfl:    DUOBRII 0.01-0.045 % LOTN, Apply 1 application topically daily., Disp: , Rfl:    finasteride (PROSCAR) 5 MG tablet, Take 5 mg by mouth daily., Disp: , Rfl:    fluticasone (CUTIVATE) 0.05 % cream, Apply 1 application topically 2 (two) times daily. Apply to face or skin fold areas use 1 week on/off as needed., Disp: , Rfl:    fluticasone (FLONASE) 50 MCG/ACT nasal spray, Place 1 spray into both nostrils daily as needed for allergies or rhinitis., Disp: , Rfl:    levocetirizine (XYZAL) 5 MG tablet,  Take 5 mg by mouth daily., Disp: , Rfl:    Multiple Vitamin (MULTIVITAMIN WITH MINERALS) TABS tablet, Take 1 tablet by mouth daily., Disp: , Rfl:    triamcinolone cream (KENALOG) 0.1 %, Apply topically 2 (two) times daily. (Patient taking differently: Apply 1 application topically 2 (two) times daily as needed (For psoriasis.).), Disp: 30 g, Rfl: 1  Observations/Objective: Patient is well-developed, well-nourished in no acute distress.  Resting comfortably  at home.  Head is normocephalic, atraumatic.  No labored breathing.  Speech is clear and coherent with logical content.  Patient is alert and oriented at baseline.  Raspy voice No cough during visit.  Assessment and Plan:  Karmine Kauer in today with chief complaint of Covid Positive   1. Positive self-administered antigen test for COVID-19 1. Take meds as prescribed 2. Use a cool mist humidifier especially during the winter months and when heat has been humid. 3. Use saline nose sprays frequently 4. Saline irrigations of the nose can be very helpful if done frequently.  * 4X daily for 1 week*  * Use of a nettie pot can be helpful with this. Follow directions with this* 5. Drink plenty of fluids 6. Keep thermostat turn down low 7.For any cough or congestion- robitussin OTC 8. For fever or aces or pains- take tylenol or ibuprofen appropriate for age and weight.  * for fevers greater than 101 orally you may alternate ibuprofen and tylenol every  3 hours.   Meds ordered this encounter  Medications   molnupiravir EUA (LAGEVRIO) 200 mg CAPS capsule    Sig: Take 4 capsules (800 mg total) by mouth 2 (two) times daily for 5 days.    Dispense:  40 capsule    Refill:  0    Order Specific Question:   Supervising Provider    Answer:   Eber Hong [[id number]]       Follow Up Instructions: I discussed the assessment and treatment plan with the patient. The patient was provided an opportunity to ask questions and all were answered.  The patient agreed with the plan and demonstrated an understanding of the instructions.  A copy of instructions were sent to the patient via MyChart.  The patient was advised to call back or seek an in-person evaluation if the symptoms worsen or if the condition fails to improve as anticipated.  Time:  I spent 6 minutes with the patient via telehealth technology discussing the above problems/concerns.    Mary-Margaret Daphine Deutscher, FNP

## 2021-11-17 ENCOUNTER — Encounter: Payer: 59 | Admitting: Family Medicine

## 2022-06-24 IMAGING — CT CT RENAL STONE PROTOCOL
2 of 4 series · 15 of 46 positions shown, 17 images · non-contrast
Comparison: 12/05/2019, 12/12/2019

CLINICAL DATA: Left flank pain, recent lithotripsy

EXAM:
CT ABDOMEN AND PELVIS WITHOUT CONTRAST
TECHNIQUE: Multidetector CT imaging of the abdomen and pelvis was performed
following the standard protocol without IV contrast.

[Series 2: axial st · axial · 0.75mm/px · z∈[+890,+1310]mm · 12 of 96 slices shown, 14 images]
[im 6/96  soft-tissue]
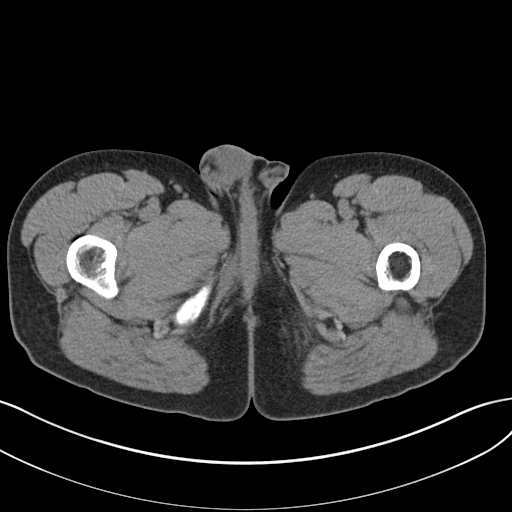
[im 6/96  bone]
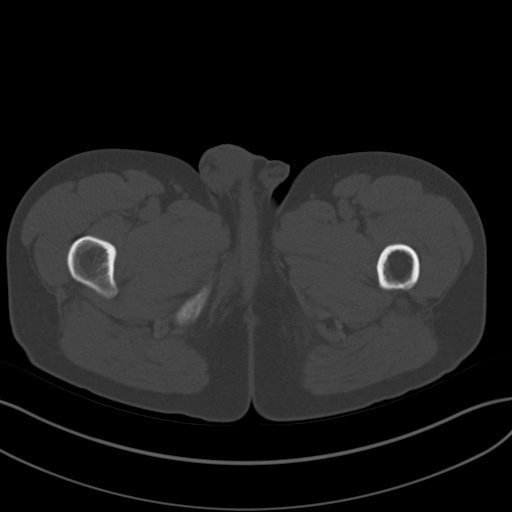
[im 16/96  soft-tissue]
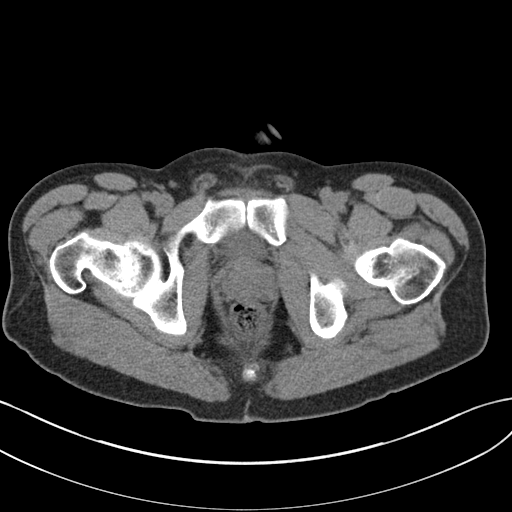
[im 22/96  soft-tissue]
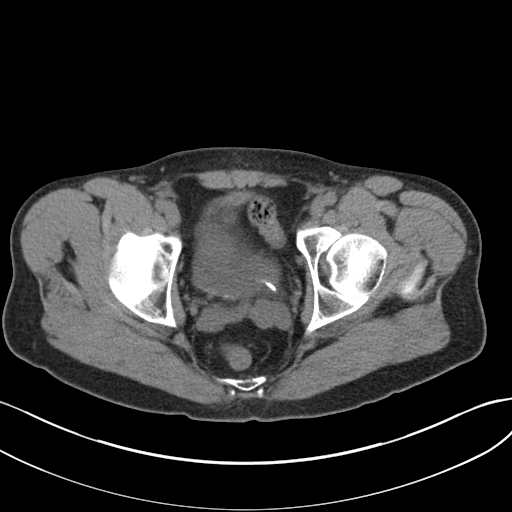
[im 27/96  soft-tissue]
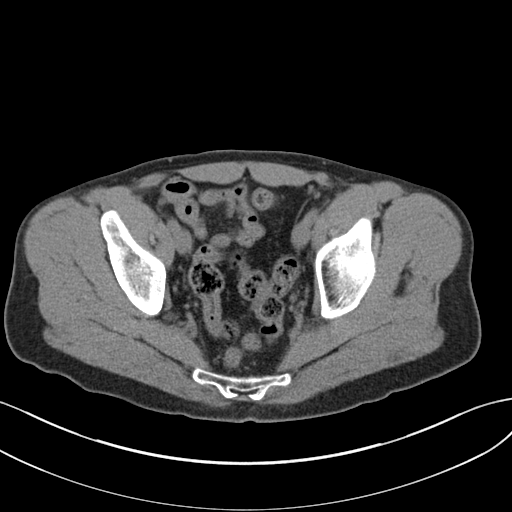
[im 37/96  soft-tissue]
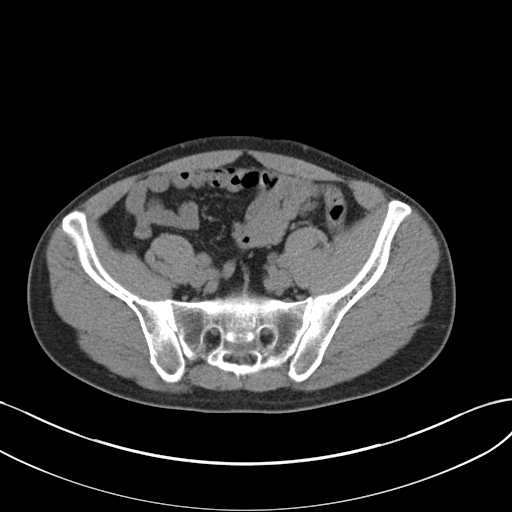
[im 43/96  soft-tissue]
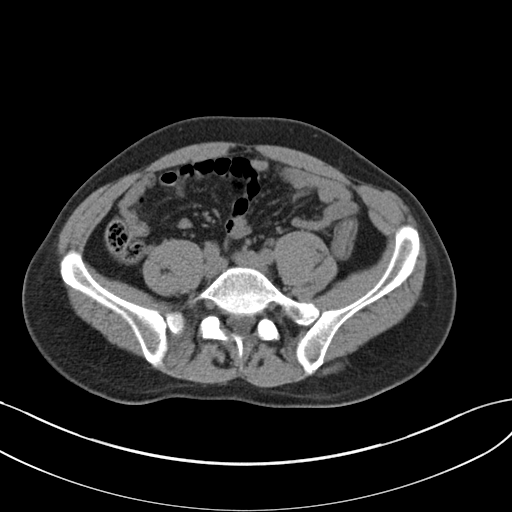
[im 53/96  soft-tissue]
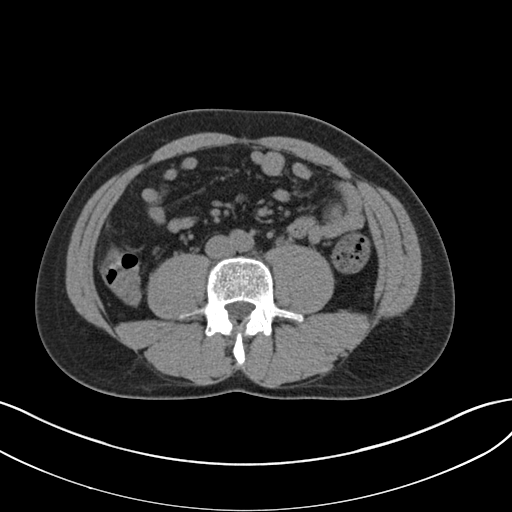
[im 59/96  soft-tissue]
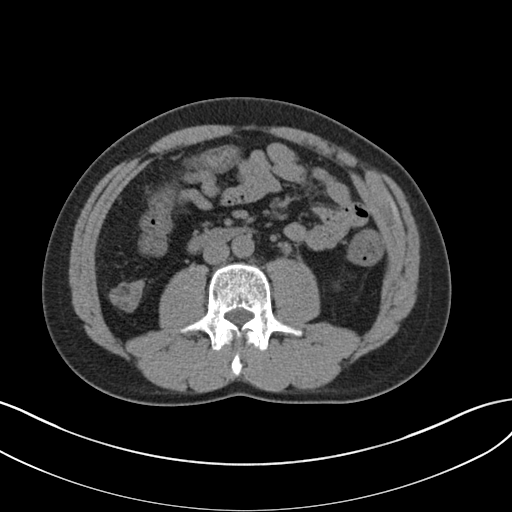
[im 69/96  soft-tissue]
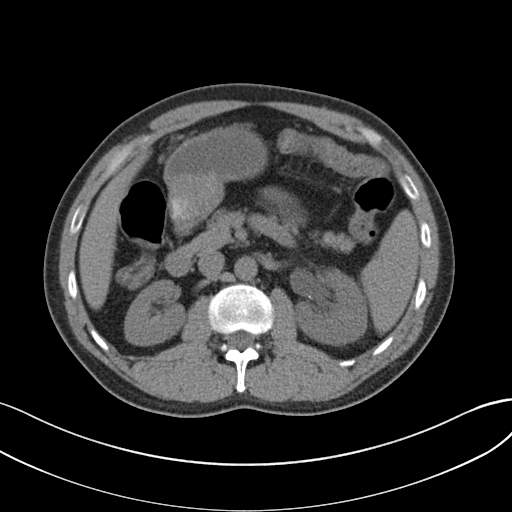
[im 69/96  bone]
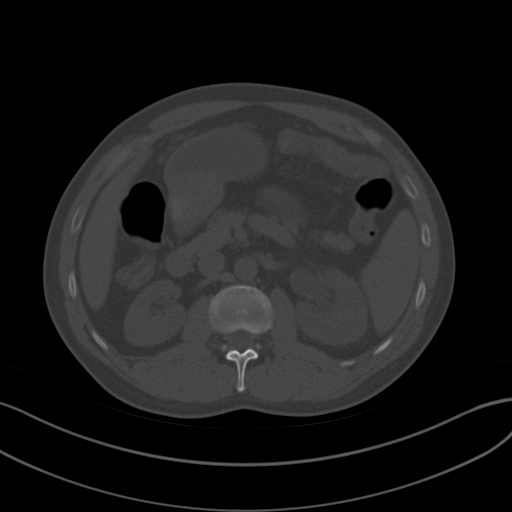
[im 74/96  soft-tissue]
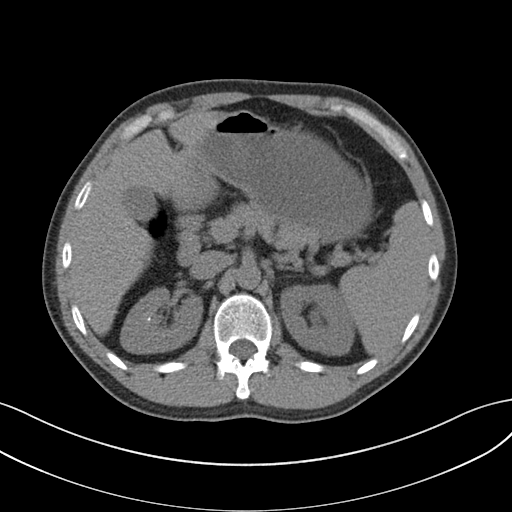
[im 80/96  soft-tissue]
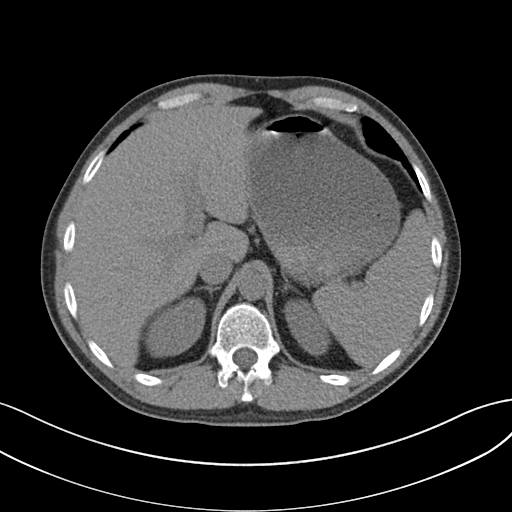
[im 90/96  soft-tissue]
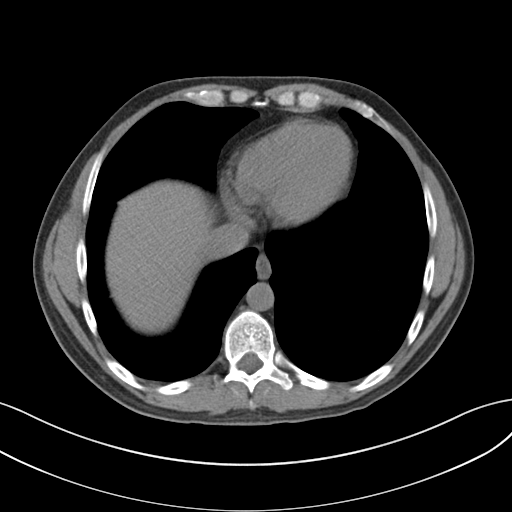

[Series 4: coronal · coronal · 0.65mm/px · 3 of 133 slices shown]
[im 45/133  soft-tissue]
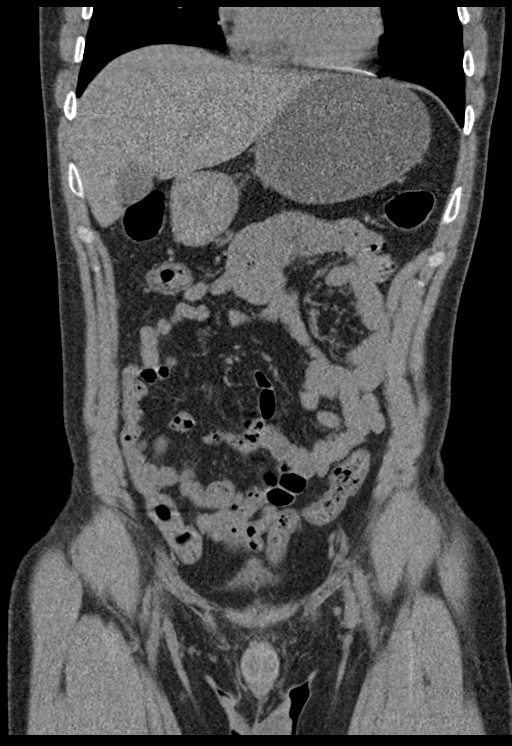
[im 59/133  soft-tissue]
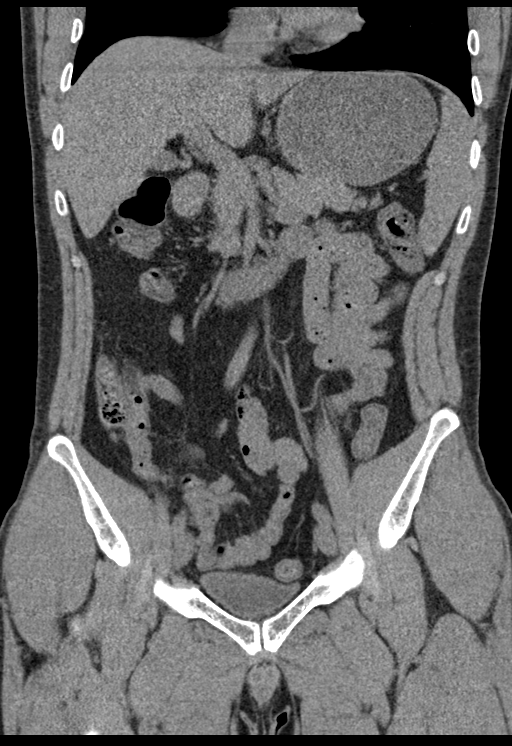
[im 74/133  soft-tissue]
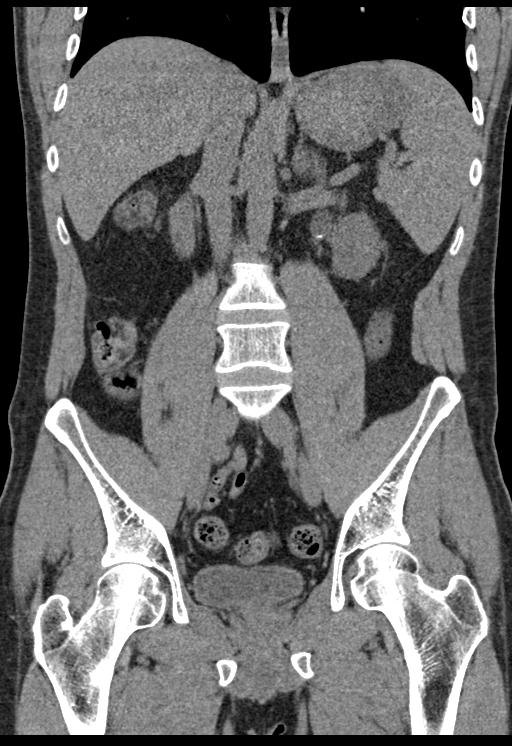

[15 of 46 positions shown; findings below may reference images not displayed]

FINDINGS: Lower chest: No acute pleural or parenchymal lung disease.

Hepatobiliary: No focal liver abnormality is seen. No gallstones,
gallbladder wall thickening, or biliary dilatation.

Pancreas: Unremarkable. No pancreatic ductal dilatation or
surrounding inflammatory changes.

Spleen: Normal in size without focal abnormality.

Adrenals/Urinary Tract: A 7 mm obstructing calculus at the left UPJ
on previous study has undergone lithotripsy and fragmentation in the
interim. There is a 3 mm fragment within the proximal left ureter
reference image 48. Multiple small fragments filled the distal left
ureter at the left UVJ, compatible with Steinstrasse. There is
progressive mild left-sided obstructive uropathy, with multiple
small nonobstructing left renal calculi visualized. The left kidney
is edematous, with trace left perirenal hemorrhage, consistent with
expected sequela of recent lithotripsy.

Punctate 2 mm nonobstructing right renal calculus is unchanged.

Bladder is moderately distended. There are least 3 small calculi
seen within the bladder lumen consistent with migrated fragments
from the left kidney after lithotripsy.

The adrenals are unremarkable.

Stomach/Bowel: No bowel obstruction or ileus. Normal appendix right
lower quadrant. No wall thickening or inflammatory change.

Vascular/Lymphatic: No significant vascular findings are present. No
enlarged abdominal or pelvic lymph nodes.

Reproductive: Prostate is unremarkable.

Other: No free fluid or free gas.  No abdominal wall hernia.

Musculoskeletal: No acute or destructive bony lesions. Reconstructed
images demonstrate no additional findings.
IMPRESSION: 1. Interval fragmentation of the left UPJ calculus after
lithotripsy, with distal migration of the fracture fragments.
Obstructing small fragments at the left UVJ consistent with
Steinstrasse, with progressive mild left-sided hydronephrosis and
hydroureter.
2. Left renal edema and trace left perirenal hemorrhage, consistent
with expected sequela of recent lithotripsy.
3. Punctate nonobstructing right renal and bladder calculi as above.

## 2022-12-25 ENCOUNTER — Emergency Department (HOSPITAL_COMMUNITY): Payer: 59

## 2022-12-25 ENCOUNTER — Encounter (HOSPITAL_COMMUNITY): Payer: Self-pay

## 2022-12-25 ENCOUNTER — Emergency Department (HOSPITAL_COMMUNITY)
Admission: EM | Admit: 2022-12-25 | Discharge: 2022-12-25 | Disposition: A | Discharge status: Home / Self Care | Payer: 59 | Attending: Emergency Medicine | Admitting: Emergency Medicine

## 2022-12-25 DIAGNOSIS — R1032 Left lower quadrant pain: Secondary | ICD-10-CM | POA: Insufficient documentation

## 2022-12-25 DIAGNOSIS — R8289 Other abnormal findings on cytological and histological examination of urine: Secondary | ICD-10-CM | POA: Insufficient documentation

## 2022-12-25 DIAGNOSIS — R109 Unspecified abdominal pain: Secondary | ICD-10-CM

## 2022-12-25 LAB — CBC
HCT: 40.8 % (ref 39.0–52.0)
Hemoglobin: 14.5 g/dL (ref 13.0–17.0)
MCH: 30 pg (ref 26.0–34.0)
MCHC: 35.5 g/dL (ref 30.0–36.0)
MCV: 84.3 fL (ref 80.0–100.0)
Platelets: 161 10*3/uL (ref 150–400)
RBC: 4.84 MIL/uL (ref 4.22–5.81)
RDW: 12.1 % (ref 11.5–15.5)
WBC: 7 10*3/uL (ref 4.0–10.5)
nRBC: 0 % (ref 0.0–0.2)

## 2022-12-25 LAB — URINALYSIS, ROUTINE W REFLEX MICROSCOPIC
Bacteria, UA: NONE SEEN
Bilirubin Urine: NEGATIVE
Glucose, UA: NEGATIVE mg/dL
Ketones, ur: 5 mg/dL — AB
Leukocytes,Ua: NEGATIVE
Nitrite: NEGATIVE
Protein, ur: 100 mg/dL — AB
RBC / HPF: >50 RBC/hpf (ref 0–5)
Specific Gravity, Urine: 1.023 (ref 1.005–1.030)
pH: 6 (ref 5.0–8.0)

## 2022-12-25 LAB — BASIC METABOLIC PANEL
Anion gap: 8 (ref 5–15)
BUN: 14 mg/dL (ref 6–20)
CO2: 25 mmol/L (ref 22–32)
Calcium: 9.2 mg/dL (ref 8.9–10.3)
Chloride: 102 mmol/L (ref 98–111)
Creatinine, Ser: 0.8 mg/dL (ref 0.61–1.24)
GFR, Estimated: >60 mL/min (ref >60)
Glucose, Bld: 109 mg/dL — ABNORMAL HIGH (ref 70–99)
Potassium: 3.8 mmol/L (ref 3.5–5.1)
Sodium: 135 mmol/L (ref 135–145)

## 2022-12-25 MED ORDER — ONDANSETRON HCL 4 MG/2ML IJ SOLN
4.0000 mg | Freq: Once | INTRAMUSCULAR | Status: AC
  Administered 2022-12-25: 4 mg via INTRAVENOUS
  Filled 2022-12-25: qty 2

## 2022-12-25 MED ORDER — HYDROMORPHONE HCL 1 MG/ML IJ SOLN
0.5000 mg | Freq: Once | INTRAMUSCULAR | Status: AC
  Administered 2022-12-25: 0.5 mg via INTRAVENOUS
  Filled 2022-12-25: qty 1

## 2022-12-25 MED ORDER — KETOROLAC TROMETHAMINE 30 MG/ML IJ SOLN
30.0000 mg | Freq: Once | INTRAMUSCULAR | Status: AC
  Administered 2022-12-25: 30 mg via INTRAVENOUS
  Filled 2022-12-25: qty 1

## 2022-12-25 MED ORDER — OXYCODONE-ACETAMINOPHEN 5-325 MG PO TABS
ORAL_TABLET | ORAL | 0 refills | Status: DC
Start: 2022-12-25 — End: 2023-02-09

## 2022-12-25 MED ORDER — PROMETHAZINE HCL 25 MG RE SUPP: 25.0000 mg | Freq: Four times a day (QID) | RECTAL | 0 refills | Status: DC | PRN

## 2022-12-25 MED ORDER — CEPHALEXIN 500 MG PO CAPS: 500.0000 mg | ORAL_CAPSULE | Freq: Four times a day (QID) | ORAL | 0 refills | Status: DC

## 2022-12-25 NOTE — Discharge Instructions (Signed)
Follow-up with your urologist this week. °

## 2022-12-25 NOTE — ED Triage Notes (Signed)
Pt arrived by POV with complaints of dysuria and back pain that radiates to the right.  Pt states the pain originated in penis this morning and has persisted with a pain is 9/10. Pt states having a hx of kidney stones  Pt denies bowel problems.

## 2022-12-26 LAB — URINE CULTURE: Culture: <10000 — AB

## 2022-12-27 NOTE — ED Provider Notes (Signed)
[place] EMERGENCY DEPARTMENT AT [facility] Provider Note   CSN: [id number] Arrival date & time: 12/25/22  0818     History  Chief Complaint  Patient presents with   Back Pain    George Peterson is a 37 y.o. male.  Patient has a history of kidney stones.  He started with flank pain and abdominal pain.  The history is provided by the patient and medical records. No language interpreter was used.  Back Pain Pain location: Flank pain. Quality:  Aching Radiates to: Left lower quadrant. Pain severity:  Moderate Pain is:  Unable to specify Onset quality:  Sudden Timing:  Constant Progression:  Waxing and waning Chronicity:  New Context: not emotional stress   Relieved by:  Nothing Worsened by:  Nothing Associated symptoms: no abdominal pain, no chest pain, no dysuria and no fever        Home Medications Prior to Admission medications   Medication Sig Start Date End Date Taking? Authorizing Provider  acetaminophen (TYLENOL) 500 MG tablet Take 500 mg by mouth every 6 (six) hours as needed for headache.    Yes [provider]  betamethasone dipropionate 0.05 % cream Apply 1 application topically 2 (two) times daily as needed.  04/15/19  Yes [provider]  cephALEXin (KEFLEX) 500 MG capsule Take 1 capsule (500 mg total) by mouth 4 (four) times daily. 12/25/22  Yes Bethann Berkshire, MD  finasteride (PROSCAR) 5 MG tablet Take 5 mg by mouth daily.   Yes [provider]  fluticasone (FLONASE) 50 MCG/ACT nasal spray Place 1 spray into both nostrils daily as needed for allergies or rhinitis.   Yes [provider]  Multiple Vitamin (MULTIVITAMIN WITH MINERALS) TABS tablet Take 1 tablet by mouth daily.   Yes [provider]  oxyCODONE-acetaminophen (PERCOCET) 5-325 MG tablet Take 1 every 6 hours for pain has not relieved by Tylenol or Motrin 12/25/22  Yes Bethann Berkshire, MD  promethazine (PHENERGAN) 25 MG suppository Place 1  suppository (25 mg total) rectally every 6 (six) hours as needed for nausea or vomiting. 12/25/22  Yes Bethann Berkshire, MD  SKYRIZI PEN 150 MG/ML pen Inject 150 mg into the skin See admin instructions. Every 12 weeks 11/10/22  Yes [provider]      Allergies    Patient has no known allergies.    Review of Systems   Review of Systems  Constitutional:  Negative for chills and fever.  HENT:  Negative for ear pain and sore throat.   Eyes:  Negative for pain and visual disturbance.  Respiratory:  Negative for cough and shortness of breath.   Cardiovascular:  Negative for chest pain and palpitations.  Gastrointestinal:  Negative for abdominal pain and vomiting.  Genitourinary:  Negative for dysuria and hematuria.  Musculoskeletal:  Positive for back pain. Negative for arthralgias.  Skin:  Negative for color change and rash.  Neurological:  Negative for seizures and syncope.  All other systems reviewed and are negative.   Physical Exam Updated Vital Signs BP 117/88   Pulse 79   Temp 97.9 F (36.6 C) (Oral)   Resp 16   SpO2 100%  Physical Exam Vitals and nursing note reviewed.  Constitutional:      General: He is in acute distress.     Appearance: He is well-developed.  HENT:     Head: Normocephalic.     Nose: Nose normal.  Eyes:     General: No scleral icterus.    Conjunctiva/sclera:  Conjunctivae normal.  Neck:     Thyroid: No thyromegaly.  Cardiovascular:     Rate and Rhythm: Normal rate and regular rhythm.     Heart sounds: No murmur heard.    No friction rub. No gallop.  Pulmonary:     Breath sounds: No stridor. No wheezing or rales.  Chest:     Chest wall: No tenderness.  Abdominal:     General: There is no distension.     Tenderness: There is abdominal tenderness. There is no rebound.  Musculoskeletal:        General: Normal range of motion.     Cervical back: Neck supple.  Lymphadenopathy:     Cervical: No cervical adenopathy.  Skin:    Findings: No  erythema or rash.  Neurological:     Mental Status: He is alert and oriented to person, place, and time.     Motor: No abnormal muscle tone.     Coordination: Coordination normal.  Psychiatric:        Behavior: Behavior normal.     ED Results / Procedures / Treatments   Labs (all labs ordered are listed, but only abnormal results are displayed) Labs Reviewed  URINE CULTURE - Abnormal; Notable for the following components:      Result Value   Culture   (*)    Value: <10,000 COLONIES/mL INSIGNIFICANT GROWTH Performed at [facility], [address], [place], [place] [address]    All other components within normal limits  BASIC METABOLIC PANEL - Abnormal; Notable for the following components:   Glucose, Bld 109 (*)    All other components within normal limits  URINALYSIS, ROUTINE W REFLEX MICROSCOPIC - Abnormal; Notable for the following components:   APPearance HAZY (*)    Hgb urine dipstick LARGE (*)    Ketones, ur 5 (*)    Protein, ur 100 (*)    All other components within normal limits  CBC    EKG None  Radiology CT Renal Stone Study  Result Date: 12/25/2022 CLINICAL DATA:  Right flank pain and dysuria. EXAM: CT ABDOMEN AND PELVIS WITHOUT CONTRAST TECHNIQUE: Multidetector CT imaging of the abdomen and pelvis was performed following the standard protocol without IV contrast. RADIATION DOSE REDUCTION: This exam was performed according to the departmental dose-optimization program which includes automated exposure control, adjustment of the mA and/or kV according to patient size and/or use of iterative reconstruction technique. COMPARISON:  11/18/2021 FINDINGS: Lower chest: The lung bases are clear of acute process. No pleural effusion or pulmonary lesions. The heart is normal in size. No pericardial effusion. The distal esophagus and aorta are unremarkable. Hepatobiliary: No focal liver abnormality is seen. No gallstones, gallbladder wall thickening, or biliary dilatation.  Pancreas: Unremarkable. No pancreatic ductal dilatation or surrounding inflammatory changes. Spleen: Spleen is upper limits of normal in size. No splenic lesions. Adrenals/Urinary Tract: Adrenal glands and kidneys are normal. No renal, ureteral or bladder calculi or mass. Stomach/Bowel: The stomach, duodenum, small bowel and colon are grossly normal without oral contrast. No inflammatory changes, mass lesions or obstructive findings. The appendix is normal. Vascular/Lymphatic: The aorta is normal in caliber. No atheroscerlotic calcifications. No mesenteric of retroperitoneal mass or adenopathy. Small scattered lymph nodes are noted. Reproductive: The prostate gland and seminal vesicles are unremarkable. Other: No pelvic mass or adenopathy. No free pelvic fluid collections. No inguinal mass or adenopathy. No abdominal wall hernia or subcutaneous lesions. No inguinal mass hernia. Musculoskeletal: No significant bony findings. IMPRESSION: 1. No  acute abdominal/pelvic findings, mass lesions or adenopathy. 2. No renal, ureteral or bladder calculi or mass. Electronically Signed   By: Rudie Meyer M.D.   On: 12/25/2022 11:14    Procedures Procedures    Medications Ordered in ED Medications  ketorolac (TORADOL) 30 MG/ML injection 30 mg (30 mg Intravenous Given 12/25/22 0910)  HYDROmorphone (DILAUDID) injection 0.5 mg (0.5 mg Intravenous Given 12/25/22 0910)  ondansetron (ZOFRAN) injection 4 mg (4 mg Intravenous Given 12/25/22 0910)  ondansetron (ZOFRAN) injection 4 mg (4 mg Intravenous Given 12/25/22 1449)    ED Course/ Medical Decision Making/ A&P                                 Medical Decision Making Amount and/or Complexity of Data Reviewed Labs: ordered. Radiology: ordered.  Risk Prescription drug management.  ,mil This patient presents to the ED for concern of flank pain and abdominal pain, this involves an extensive number of treatment options, and is a complaint that carries with it a high  risk of complications and morbidity.  The differential diagnosis includes UTI, kidney stone   Co morbidities that complicate the patient evaluation  Kidney stones   Additional history obtained:  Additional history obtained from patient External records from outside source obtained and reviewed including hospital record   Lab Tests:  I Ordered, and personally interpreted labs.  The pertinent results include: Urinalysis shows greater than 50 red cells and 11-20 white cells   Imaging Studies ordered:  I ordered imaging studies including CT abdomen I independently visualized and interpreted imaging which showed negative I agree with the radiologist interpretation   Cardiac Monitoring: / EKG:  The patient was maintained on a cardiac monitor.  I personally viewed and interpreted the cardiac monitored which showed an underlying rhythm of: Normal sinus rhythm   Consultations Obtained:  No consultant Problem List / ED Course / Critical interventions / Medication management  Kidney stones and flank pain I ordered medication including Dilaudid and Toradol Reevaluation of the patient after these medicines showed that the patient improved I have reviewed the patients home medicines and have made adjustments as needed   Social Determinants of Health:  None   Test / Admission - Considered:  None  Patient with abdominal pain and flank pain.  Most likely passed kidney stone.  We will culture his urine and have him follow-up with urology        Final Clinical Impression(s) / ED Diagnoses Final diagnoses:  Flank pain, acute    Rx / DC Orders ED Discharge Orders          Ordered    oxyCODONE-acetaminophen (PERCOCET) 5-325 MG tablet        12/25/22 1432    promethazine (PHENERGAN) 25 MG suppository  Every 6 hours PRN        12/25/22 1432    cephALEXin (KEFLEX) 500 MG capsule  4 times daily        12/25/22 1432              Bethann Berkshire, MD 12/27/22  1027

## 2023-02-09 ENCOUNTER — Encounter: Payer: Self-pay | Admitting: Family Medicine

## 2023-02-09 ENCOUNTER — Ambulatory Visit: Payer: 59 | Admitting: Family Medicine

## 2023-02-09 VITALS — BP 122/82 | HR 59 | Temp 97.7°F | Ht 68.0 in | Wt 161.2 lb

## 2023-02-09 DIAGNOSIS — Z1159 Encounter for screening for other viral diseases: Secondary | ICD-10-CM

## 2023-02-09 DIAGNOSIS — R140 Abdominal distension (gaseous): Secondary | ICD-10-CM | POA: Diagnosis not present

## 2023-02-09 DIAGNOSIS — R110 Nausea: Secondary | ICD-10-CM | POA: Diagnosis not present

## 2023-02-09 DIAGNOSIS — Z114 Encounter for screening for human immunodeficiency virus [HIV]: Secondary | ICD-10-CM

## 2023-02-09 NOTE — Progress Notes (Signed)
Established Patient Office Visit  Subjective   Patient ID: George Peterson, male    DOB: 10-21-1985  Age: 37 y.o. MRN: [id number]  Chief Complaint  Patient presents with   Establish Care   Tailbone Pain    Right-sided x4-6 weeks, no known injury, and concerned due to history of pilonidal cyst   Abdominal Pain    Intermittently x2 months, denies diarrhea or vomiting    Pt reports 4-6 weeks history of "tailbone" pain. He reports there was no injury, states that it just gradually came on over time. States that it comes and goes, no changes in bowel habits, but possibly reports some "spasming in his bottom" states he did have a pilonidal cyst years ago that was removed in 2007-8. Kind of feels like a soreness/bruising sensation, achy pressure, states that when he switches positions it does improve.   Pt is also reporting "stomach" discomfort. States that he has not been able to figure out a pattern-- a bit of gas/ nausea pain, sometimes occurs with meals, kind of in the mid abdomen. Has not had any vomiting, more of a bloating sensation. No fever or chills, no other associated symptoms, appetite it "ok" no weight loss or gain recently.   Reports his mom had "precancerous" polyps on her colonoscopy a few year.     Current Outpatient Medications  Medication Instructions   acetaminophen (TYLENOL) 500 mg, Oral, Every 6 hours PRN   betamethasone dipropionate 0.05 % cream 1 application , Topical, 2 times daily PRN   finasteride (PROSCAR) 5 mg, Oral, Daily   fluticasone (FLONASE) 50 MCG/ACT nasal spray 1 spray, Each Nare, Daily PRN   Multiple Vitamin (MULTIVITAMIN WITH MINERALS) TABS tablet 1 tablet, Oral, Daily   Skyrizi Pen 150 mg, Subcutaneous, See admin instructions, Every 12 weeks    Patient Active Problem List   Diagnosis Date Noted   Psoriasis 09/07/2018   Nephrolithiasis 09/07/2018   Exposure to COVID-19 virus 09/07/2018      Review of Systems  All other systems reviewed and  are negative.     Objective:     BP 122/82 (BP Location: Left Arm, Patient Position: Sitting, Cuff Size: Normal)   Pulse (!) 59   Temp 97.7 F (36.5 C) (Oral)   Ht 5\' 8"  (1.727 m)   Wt 161 lb 3.2 oz (73.1 kg)   SpO2 98%   BMI 24.51 kg/m    Physical Exam Vitals reviewed.  Constitutional:      Appearance: Normal appearance. He is well-groomed and normal weight.  Cardiovascular:     Rate and Rhythm: Normal rate and regular rhythm.     Heart sounds: S1 normal and S2 normal. No murmur heard. Pulmonary:     Effort: Pulmonary effort is normal.     Breath sounds: Normal breath sounds and air entry.  Abdominal:     General: Abdomen is flat. Bowel sounds are normal.     Palpations: Abdomen is soft.  Genitourinary:    Rectum: Normal. No mass, tenderness, anal fissure or external hemorrhoid.  Neurological:     General: No focal deficit present.     Mental Status: He is alert and oriented to person, place, and time.     Gait: Gait is intact.  Psychiatric:        Mood and Affect: Mood and affect normal.      No results found for any visits on 02/09/23.    The ASCVD Risk score (Arnett DK, et al., 2019) failed to  calculate for the following reasons:   The 2019 ASCVD risk score is only valid for ages 27 to 90    Assessment & Plan:  Nausea Unclear etiology, pt had CT abd which was unremarkable, will order HIDA scan and hepatic function panel to rule out some etiologies. Rectal and abdominal exam are WNL.   -     Hepatic function panel; Future -     NM Hepato W/EF; Future  Need for hepatitis C screening test -     Hepatitis C antibody; Future  Encounter for screening for HIV -     HIV Antibody (routine testing w rflx); Future  Postprandial abdominal bloating -     NM Hepato W/EF; Future     Return for annual physical exam.    Karie Georges, MD

## 2023-02-09 NOTE — Patient Instructions (Signed)
Probiotics-- lactobacillus acidophillus  --Florastor  Acitve culture -- yogurt or milk,

## 2023-02-15 ENCOUNTER — Other Ambulatory Visit (INDEPENDENT_AMBULATORY_CARE_PROVIDER_SITE_OTHER): Payer: 59

## 2023-02-15 DIAGNOSIS — R110 Nausea: Secondary | ICD-10-CM

## 2023-02-15 DIAGNOSIS — Z114 Encounter for screening for human immunodeficiency virus [HIV]: Secondary | ICD-10-CM

## 2023-02-15 DIAGNOSIS — Z1159 Encounter for screening for other viral diseases: Secondary | ICD-10-CM

## 2023-02-15 LAB — HEPATIC FUNCTION PANEL
ALT: 20 U/L (ref 0–53)
AST: 19 U/L (ref 0–37)
Albumin: 4.8 g/dL (ref 3.5–5.2)
Alkaline Phosphatase: 51 U/L (ref 39–117)
Bilirubin, Direct: 0.2 mg/dL (ref 0.0–0.3)
Total Bilirubin: 1 mg/dL (ref 0.2–1.2)
Total Protein: 7 g/dL (ref 6.0–8.3)

## 2023-02-17 LAB — HEPATITIS C ANTIBODY: Hepatitis C Ab: NONREACTIVE

## 2023-02-17 LAB — HIV ANTIBODY (ROUTINE TESTING W REFLEX): HIV 1&2 Ab, 4th Generation: NONREACTIVE

## 2023-03-02 ENCOUNTER — Emergency Department (HOSPITAL_BASED_OUTPATIENT_CLINIC_OR_DEPARTMENT_OTHER)
Admission: EM | Admit: 2023-03-02 | Discharge: 2023-03-03 | Disposition: A | Discharge status: Home / Self Care | Payer: 59 | Attending: Emergency Medicine | Admitting: Emergency Medicine

## 2023-03-02 ENCOUNTER — Emergency Department (HOSPITAL_BASED_OUTPATIENT_CLINIC_OR_DEPARTMENT_OTHER): Payer: 59

## 2023-03-02 ENCOUNTER — Other Ambulatory Visit: Payer: Self-pay

## 2023-03-02 ENCOUNTER — Emergency Department (HOSPITAL_COMMUNITY): Payer: 59

## 2023-03-02 ENCOUNTER — Encounter (HOSPITAL_BASED_OUTPATIENT_CLINIC_OR_DEPARTMENT_OTHER): Payer: Self-pay | Admitting: Emergency Medicine

## 2023-03-02 DIAGNOSIS — R519 Headache, unspecified: Secondary | ICD-10-CM | POA: Diagnosis not present

## 2023-03-02 DIAGNOSIS — R202 Paresthesia of skin: Secondary | ICD-10-CM | POA: Diagnosis present

## 2023-03-02 DIAGNOSIS — R200 Anesthesia of skin: Secondary | ICD-10-CM

## 2023-03-02 LAB — CBC WITH DIFFERENTIAL/PLATELET
Abs Immature Granulocytes: 0.02 10*3/uL (ref 0.00–0.07)
Basophils Absolute: 0 10*3/uL (ref 0.0–0.1)
Basophils Relative: 1 %
Eosinophils Absolute: 0.1 10*3/uL (ref 0.0–0.5)
Eosinophils Relative: 2 %
HCT: 41.6 % (ref 39.0–52.0)
Hemoglobin: 14.7 g/dL (ref 13.0–17.0)
Immature Granulocytes: 0 %
Lymphocytes Relative: 34 %
Lymphs Abs: 2.1 10*3/uL (ref 0.7–4.0)
MCH: 29.7 pg (ref 26.0–34.0)
MCHC: 35.3 g/dL (ref 30.0–36.0)
MCV: 84 fL (ref 80.0–100.0)
Monocytes Absolute: 0.4 10*3/uL (ref 0.1–1.0)
Monocytes Relative: 7 %
Neutro Abs: 3.5 10*3/uL (ref 1.7–7.7)
Neutrophils Relative %: 56 %
Platelets: 189 10*3/uL (ref 150–400)
RBC: 4.95 MIL/uL (ref 4.22–5.81)
RDW: 12.1 % (ref 11.5–15.5)
WBC: 6.1 10*3/uL (ref 4.0–10.5)
nRBC: 0 % (ref 0.0–0.2)

## 2023-03-02 LAB — COMPREHENSIVE METABOLIC PANEL
ALT: 22 U/L (ref 0–44)
AST: 21 U/L (ref 15–41)
Albumin: 4.8 g/dL (ref 3.5–5.0)
Alkaline Phosphatase: 53 U/L (ref 38–126)
Anion gap: 8 (ref 5–15)
BUN: 7 mg/dL (ref 6–20)
CO2: 28 mmol/L (ref 22–32)
Calcium: 9.6 mg/dL (ref 8.9–10.3)
Chloride: 105 mmol/L (ref 98–111)
Creatinine, Ser: 0.77 mg/dL (ref 0.61–1.24)
GFR, Estimated: >60 mL/min (ref >60)
Glucose, Bld: 103 mg/dL — ABNORMAL HIGH (ref 70–99)
Potassium: 3.7 mmol/L (ref 3.5–5.1)
Sodium: 141 mmol/L (ref 135–145)
Total Bilirubin: 0.8 mg/dL (ref <1.2)
Total Protein: 7.5 g/dL (ref 6.5–8.1)

## 2023-03-02 NOTE — Discharge Instructions (Addendum)
Please go to [facility] as we discussed for MRI.  This has been ordered.

## 2023-03-02 NOTE — ED Triage Notes (Signed)
Left mouth and tongue numbness 1 hr PTA. Followed by left arm heaviness. Left sided intermittent headache.

## 2023-03-02 NOTE — ED Provider Notes (Signed)
[place] EMERGENCY DEPARTMENT AT [facility] Provider Note   CSN: [id number] Arrival date & time: 03/02/23  1750     History  Chief Complaint  Patient presents with   Numbness    George Peterson is a 37 y.o. male.  Patient with history of psoriasis on risankizumab, kidney stones --presents to the emergency department tonight for evaluation of left-sided facial and left arm numbness.  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  He developed numbness of his left face described as what she would feel after getting Novocain.  No facial weakness or slurred speech.  Shortly afterwards he developed pins-and-needles in his left arm that extends from the shoulder to the fingertip.  Again the weakness.  He describes this as waking up after having slept on the arm, but not getting better.  Around 6:00 PM and after he had a couple episodes of very short-lived left sided headache.  No other vision change or aura.  No history of frequent migraines. Patient denies signs of stroke including: facial droop, slurred speech, aphasia, weakness/numbness in extremities, imbalance/trouble walking.  Symptoms have persisted.  No neck pain.  No recent head injuries.       Home Medications Prior to Admission medications   Medication Sig Start Date End Date Taking? Authorizing Provider  acetaminophen (TYLENOL) 500 MG tablet Take 500 mg by mouth every 6 (six) hours as needed for headache.     [provider]  betamethasone dipropionate 0.05 % cream Apply 1 application topically 2 (two) times daily as needed.  04/15/19   [provider]  finasteride (PROSCAR) 5 MG tablet Take 5 mg by mouth daily.    [provider]  fluticasone (FLONASE) 50 MCG/ACT nasal spray Place 1 spray into both nostrils daily as needed for allergies or rhinitis.    [provider]  Multiple Vitamin (MULTIVITAMIN WITH MINERALS) TABS tablet Take 1 tablet by mouth daily.    [provider]  SKYRIZI PEN 150 MG/ML pen Inject 150 mg into the skin See admin instructions. Every 12 weeks 11/10/22   [provider]      Allergies    Patient has no known allergies.    Review of Systems   Review of Systems  Physical Exam Updated Vital Signs BP (!) 152/105 (BP Location: Right Arm)   Pulse 87   Temp 98.2 F (36.8 C)   Resp 18   SpO2 100%   Physical Exam Vitals and nursing note reviewed.  Constitutional:      General: He is not in acute distress.    Appearance: He is well-developed.  HENT:     Head: Normocephalic and atraumatic.     Right Ear: Tympanic membrane, ear canal and external ear normal.     Left Ear: Tympanic membrane, ear canal and external ear normal.     Nose: Nose normal.     Mouth/Throat:     Mouth: Mucous membranes are moist.     Pharynx: Uvula midline.  Eyes:     General: Lids are normal.        Right eye: No discharge.        Left eye: No discharge.     Conjunctiva/sclera: Conjunctivae normal.     Pupils: Pupils are equal, round, and reactive to light.  Cardiovascular:     Rate and Rhythm: Normal rate and regular rhythm.     Heart sounds: Normal heart sounds.  Pulmonary:     Effort: Pulmonary effort  is normal.     Breath sounds: Normal breath sounds.  Abdominal:     Palpations: Abdomen is soft.     Tenderness: There is no abdominal tenderness.  Musculoskeletal:        General: Normal range of motion.     Cervical back: Normal range of motion and neck supple. No tenderness or bony tenderness.  Skin:    General: Skin is warm and dry.  Neurological:     Mental Status: He is alert and oriented to person, place, and time.     GCS: GCS eye subscore is 4. GCS verbal subscore is 5. GCS motor subscore is 6.     Cranial Nerves: No cranial nerve deficit.     Sensory: Sensory deficit present.     Motor: No abnormal muscle tone.     Coordination: Coordination normal.     Gait: Gait normal.     Comments: Patient reports sensation deficit  left cheek, cranial nerves intact.  Normal strength upper and lower extremities.     ED Results / Procedures / Treatments   Labs (all labs ordered are listed, but only abnormal results are displayed) Labs Reviewed  COMPREHENSIVE METABOLIC PANEL - Abnormal; Notable for the following components:      Result Value   Glucose, Bld 103 (*)    All other components within normal limits  CBC WITH DIFFERENTIAL/PLATELET    EKG None  Radiology CT Head Wo Contrast Result Date: 03/02/2023 CLINICAL DATA:  Headache, facial numbness, left arm heaviness EXAM: CT HEAD WITHOUT CONTRAST TECHNIQUE: Contiguous axial images were obtained from the base of the skull through the vertex without intravenous contrast. RADIATION DOSE REDUCTION: This exam was performed according to the departmental dose-optimization program which includes automated exposure control, adjustment of the mA and/or kV according to patient size and/or use of iterative reconstruction technique. COMPARISON:  None. FINDINGS: Brain: No evidence of acute infarction, hemorrhage, hydrocephalus, extra-axial collection or mass lesion/mass effect. Vascular: No hyperdense vessel or unexpected calcification. Skull: Normal. Negative for fracture or focal lesion. Sinuses/Orbits: The visualized paranasal sinuses are essentially clear. The mastoid air cells are unopacified. Other: None. IMPRESSION: Normal head CT. Electronically Signed   By: Charline Bills M.D.   On: 03/02/2023 19:28    Procedures Procedures    Medications Ordered in ED Medications - No data to display  ED Course/ Medical Decision Making/ A&P    Patient seen and examined. History obtained directly from patient. Work-up including labs, imaging, EKG ordered in triage, if performed, were reviewed.    Labs/EKG: Independently reviewed and interpreted.  This included: CBC unremarkable; CMP unremarkable  Imaging: Independently visualized and interpreted.  This included: CT head agree  negative.  Medications/Fluids: None ordered  Most recent vital signs reviewed and are as follows: BP (!) 152/105 (BP Location: Right Arm)   Pulse 87   Temp 98.2 F (36.8 C)   Resp 18   SpO2 100%   Initial impression: Patient with left-sided facial and upper extremity sensory deficits, no weakness, reassuring exam.  No code stroke due to minimal symptoms, not deemed candidate for tnkase.     Patient was discussed with and seen by Dr. Eloise Harman.  Plan for transfer for MRI brain.  No objective findings at time of his exam.  Reassessment performed. Patient appears stable.  Reviewed pertinent lab work and imaging with patient at bedside. Questions answered.   Most current vital signs reviewed and are as follows: BP (!) 152/105 (BP Location: Right Arm)  Pulse 87   Temp 98.2 F (36.8 C)   Resp 18   Ht 5\' 8"  (1.727 m)   Wt 71.7 kg   SpO2 100%   BMI 24.02 kg/m   Plan: Transfer to [facility] for MRI brain.  Dr. Silverio Lay accepting.   Patient and family at bedside are in agreement plan.  We discussed risks and benefits of transfer by personal vehicle and he would prefer to go by personal vehicle driven by family member.                                  Medical Decision Making Amount and/or Complexity of Data Reviewed Radiology: ordered.   Patient with acute onset of left-sided facial numbness and left upper extremity "heaviness" and paresthesias.  Reassuring exam here.  No code stroke.  Initial labs and CT imaging negative.  Patient will be transferred for MRI to rule out stroke.  If remainder of workup is negative, he can likely be discharged with close outpatient follow-up.  No neck pain or severe headache to suggest subarachnoid hemorrhage.  No injury to suggest other intracranial bleeding.  Overall low concern for carotid or vertebral artery dissection given symptoms.  Considered complex migraine however this would be atypical for the patient and he has had only mild  headache with these current symptoms, but remains on differential.        Final Clinical Impression(s) / ED Diagnoses Final diagnoses:  Left facial numbness  Paresthesia of left arm    Rx / DC Orders ED Discharge Orders     None         Renne Crigler, Cordelia Poche 03/02/23 2143    Rondel Baton, MD 03/04/23 1326

## 2023-03-02 NOTE — ED Provider Triage Note (Signed)
Emergency Medicine Provider Triage Evaluation Note  Khamoni Deutscher , a 37 y.o. male  was evaluated in triage.  Pt complains of left lower facial numbness and left arm heaviness.  This started an hour ago.  He denies any focal weakness, trouble talking, trouble walking, fever, chills.    Review of Systems  Positive:  Negative: See above  Physical Exam  BP (!) 152/105 (BP Location: Right Arm)   Pulse 87   Temp 98.2 F (36.8 C)   Resp 18   SpO2 100%  Gen:   Awake, no distress   Resp:  Normal effort  MSK:   Moves extremities without difficulty  Other:  Cranial nerves II through XII are intact.  5/5 strength to the upper and lower extremities.  Normal sensation to the upper and lower extremities.  No dysmetria with finger-to-nose.  Extraocular movements are intact.  Medical Decision Making  Medically screening exam initiated at 6:29 PM.  Appropriate orders placed.  Cinque Rial was informed that the remainder of the evaluation will be completed by another provider, this initial triage assessment does not replace that evaluation, and the importance of remaining in the ED until their evaluation is complete.  Evaluated in triage.  No evidence of acute stroke at this time.   Honor Loh [place], [place] 03/02/23 [id number]

## 2023-03-03 NOTE — ED Notes (Signed)
Pt in MRI.

## 2023-03-03 NOTE — ED Provider Notes (Signed)
Patient transferred for MRI, MRI is normal Will have patient follow up as outpatient  NCAT EOMI RRR CTAN BABS  Results for orders placed or performed during the hospital encounter of 03/02/23  CBC with Differential   Collection Time: 03/02/23  6:30 PM  Result Value Ref Range   WBC 6.1 4.0 - 10.5 K/uL   RBC 4.95 4.22 - 5.81 MIL/uL   Hemoglobin 14.7 13.0 - 17.0 g/dL   HCT 40.9 81.1 - 91.4 %   MCV 84.0 80.0 - 100.0 fL   MCH 29.7 26.0 - 34.0 pg   MCHC 35.3 30.0 - 36.0 g/dL   RDW 78.2 95.6 - 21.3 %   Platelets 189 150 - 400 K/uL   nRBC 0.0 0.0 - 0.2 %   Neutrophils Relative % 56 %   Neutro Abs 3.5 1.7 - 7.7 K/uL   Lymphocytes Relative 34 %   Lymphs Abs 2.1 0.7 - 4.0 K/uL   Monocytes Relative 7 %   Monocytes Absolute 0.4 0.1 - 1.0 K/uL   Eosinophils Relative 2 %   Eosinophils Absolute 0.1 0.0 - 0.5 K/uL   Basophils Relative 1 %   Basophils Absolute 0.0 0.0 - 0.1 K/uL   Immature Granulocytes 0 %   Abs Immature Granulocytes 0.02 0.00 - 0.07 K/uL  Comprehensive metabolic panel   Collection Time: 03/02/23  6:30 PM  Result Value Ref Range   Sodium 141 135 - 145 mmol/L   Potassium 3.7 3.5 - 5.1 mmol/L   Chloride 105 98 - 111 mmol/L   CO2 28 22 - 32 mmol/L   Glucose, Bld 103 (H) 70 - 99 mg/dL   BUN 7 6 - 20 mg/dL   Creatinine, Ser 0.86 0.61 - 1.24 mg/dL   Calcium 9.6 8.9 - 57.8 mg/dL   Total Protein 7.5 6.5 - 8.1 g/dL   Albumin 4.8 3.5 - 5.0 g/dL   AST 21 15 - 41 U/L   ALT 22 0 - 44 U/L   Alkaline Phosphatase 53 38 - 126 U/L   Total Bilirubin 0.8 <1.2 mg/dL   GFR, Estimated >46 >96 mL/min   Anion gap 8 5 - 15   CT Head Wo Contrast Result Date: 03/02/2023 CLINICAL DATA:  Headache, facial numbness, left arm heaviness EXAM: CT HEAD WITHOUT CONTRAST TECHNIQUE: Contiguous axial images were obtained from the base of the skull through the vertex without intravenous contrast. RADIATION DOSE REDUCTION: This exam was performed according to the departmental dose-optimization program  which includes automated exposure control, adjustment of the mA and/or kV according to patient size and/or use of iterative reconstruction technique. COMPARISON:  None. FINDINGS: Brain: No evidence of acute infarction, hemorrhage, hydrocephalus, extra-axial collection or mass lesion/mass effect. Vascular: No hyperdense vessel or unexpected calcification. Skull: Normal. Negative for fracture or focal lesion. Sinuses/Orbits: The visualized paranasal sinuses are essentially clear. The mastoid air cells are unopacified. Other: None. IMPRESSION: Normal head CT. Electronically Signed   By: Charline Bills M.D.   On: 03/02/2023 19:28   Refer to neuro as an outpatient   Jamicah Anstead, MD 03/03/23 0119

## 2023-03-31 ENCOUNTER — Ambulatory Visit: Payer: 59 | Admitting: Neurology

## 2023-03-31 ENCOUNTER — Encounter: Payer: Self-pay | Admitting: Neurology

## 2023-03-31 VITALS — BP 128/83 | HR 70 | Ht 68.0 in | Wt 165.0 lb

## 2023-03-31 DIAGNOSIS — G459 Transient cerebral ischemic attack, unspecified: Secondary | ICD-10-CM

## 2023-03-31 DIAGNOSIS — G4089 Other seizures: Secondary | ICD-10-CM | POA: Diagnosis not present

## 2023-03-31 DIAGNOSIS — R200 Anesthesia of skin: Secondary | ICD-10-CM

## 2023-03-31 DIAGNOSIS — G43109 Migraine with aura, not intractable, without status migrainosus: Secondary | ICD-10-CM | POA: Diagnosis not present

## 2023-03-31 DIAGNOSIS — R202 Paresthesia of skin: Secondary | ICD-10-CM

## 2023-03-31 NOTE — Progress Notes (Addendum)
 [facility]    Provider:  Dr Ines Requesting Provider: Ozell Heron HERO, MD Primary Care Provider:  Ozell Heron HERO, MD  CC:  episode of left-sided numbness  Addendum 04/26/2023: Stroke-like episode. Bubble study with pfo. Will send to cardiology(VERY much appreciate my colleagues in cardiology) Please evaluate for TEE or any procedure per clinical evaluation. Echo 04/19/2023: Conclusion(s)/Recommendation(s): Consider a transesophageal echocardiogram to exclude cardiac source of embolism and furthe assess mitral valve   HPI:  George Peterson is a 38 y.o. male here as requested by Ozell Heron HERO, MD for stroke-like episode. has Psoriasis; Nephrolithiasis; and Exposure to COVID-19 virus on their problem list.  Started on Thursday he was working at home and he started feeling numbness just on the left side of the face a tngue never happened before then 10-15 minutes his arm became numb. He was still numb when he went to the ED. It started a little after 5pm. Ended at the ED 45 minutes. Left face was lower face and the whole arm from shoulder all the way down, no weakness, no facial droop. Lasted 5 hours and has not returned. He had some headaches on the left side of the head mild would last briefly and come and go. No vision changes, no droopy eye lid, no lower facial droop, his left eye would twitch, no vision  changes. He had some twitching in the eye unrelated. He had a dilated eye exam prior to this event and was normal 01/30/2023. He had pins and needles in the laeft arm. Never had seizues, mother has migraines/headaches, sibling without migrains. No neck pain. No cervical radiculopathy. Still has palpitations and racing heart, was not having it since this.   Reviewed notes, labs and imaging from outside physicians, which showed:  Had palpitations in 2022 and had an echo:   1. Left ventricular ejection fraction, by estimation, is 60 to 65%. The  left ventricle  has normal function. The left ventricle has no regional  wall motion abnormalities. Left ventricular diastolic parameters were  normal.   2. Right ventricular systolic function is normal. The right ventricular  size is normal. Tricuspid regurgitation signal is inadequate for assessing  PA pressure.   3. The mitral valve is normal in structure. Mild mitral valve  regurgitation. No evidence of mitral stenosis.   4. The aortic valve is normal in structure. Aortic valve regurgitation is  not visualized. No aortic stenosis is present.   5. The inferior vena cava is normal in size with greater than 50%  respiratory variability, suggesting right atrial pressure of 3 mmHg.   Mti brain 03/02/2023:  EXAM: MRI HEAD WITHOUT CONTRAST   TECHNIQUE: Multiplanar, multiecho pulse sequences of the brain and surrounding structures were obtained without intravenous contrast.   COMPARISON:  Prior head CT from 03/02/2023.   FINDINGS: Brain: Cerebral volume within normal limits for age. No focal parenchymal signal abnormality.   No abnormal foci of restricted diffusion to suggest acute or subacute ischemia. Gray-white matter differentiation well maintained. No encephalomalacia to suggest chronic cortical infarction or other insult. No foci of susceptibility artifact indicative of acute or chronic intracranial blood products.   No mass lesion, midline shift or mass effect. Ventricles normal in size and morphology without hydrocephalus. No extra-axial fluid collection.   Pituitary gland and suprasellar region within normal limits.   Vascular: Major intracranial vascular flow voids are well maintained.   Skull and upper cervical spine: Craniocervical junction within normal limits. Visualized upper cervical  spine demonstrates no significant finding. Bone marrow signal intensity within normal limits. No scalp soft tissue abnormality.   Sinuses/Orbits: Globes and orbital soft tissues are within  normal limits.   Paranasal sinuses are largely clear. No significant mastoid effusion.   Other: None.   IMPRESSION: Normal brain MRI. No acute intracranial abnormality identified.     Electronically Signed   By: Morene Hoard M.D.   On: 03/03/2023 00:50     Latest Ref Rng & Units 03/02/2023    6:30 PM 12/25/2022    9:00 AM 04/26/2021   11:25 AM  CBC  WBC 4.0 - 10.5 K/uL 6.1  7.0  7.1   Hemoglobin 13.0 - 17.0 g/dL 85.2  85.4  85.2   Hematocrit 39.0 - 52.0 % 41.6  40.8  42.7   Platelets 150 - 400 K/uL 189  161  212.0        Latest Ref Rng & Units 03/02/2023    6:30 PM 02/15/2023    8:28 AM 12/25/2022    9:00 AM  CMP  Glucose 70 - 99 mg/dL 896   890   BUN 6 - 20 mg/dL 7   14   Creatinine 9.38 - 1.24 mg/dL 9.22   9.19   Sodium 864 - 145 mmol/L 141   135   Potassium 3.5 - 5.1 mmol/L 3.7   3.8   Chloride 98 - 111 mmol/L 105   102   CO2 22 - 32 mmol/L 28   25   Calcium 8.9 - 10.3 mg/dL 9.6   9.2   Total Protein 6.5 - 8.1 g/dL 7.5  7.0    Total Bilirubin <1.2 mg/dL 0.8  1.0    Alkaline Phos 38 - 126 U/L 53  51    AST 15 - 41 U/L 21  19    ALT 0 - 44 U/L 22  20       Review of Systems: Patient complains of symptoms per HPI as well as the following symptoms none. Pertinent negatives and positives per HPI. All others negative.   Social History   Socioeconomic History   Marital status: Married    Spouse name: Not on file   Number of children: Not on file   Years of education: Not on file   Highest education level: Bachelor's degree (e.g., BA, AB, BS)  Occupational History   Not on file  Tobacco Use   Smoking status: Never   Smokeless tobacco: Never  Substance and Sexual Activity   Alcohol use: Yes    Comment: occ, 2-4 drinks a few times per month   Drug use: No   Sexual activity: Yes  Other Topics Concern   Not on file  Social History Narrative   Work or School: full time Museum/gallery Curator      Home Situation: living with wife      Spiritual Beliefs:  none      Lifestyle: goes to the gym 6 x per week and running, healthy diet            Social Drivers of Health   Financial Resource Strain: Medium Risk (04/25/2021)   Overall Financial Resource Strain (CARDIA)    Difficulty of Paying Living Expenses: Somewhat hard  Food Insecurity: No Food Insecurity (04/25/2021)   Hunger Vital Sign    Worried About Running Out of Food in the Last Year: Never true    Ran Out of Food in the Last Year: Never true  Transportation Needs: No Transportation Needs (  04/25/2021)   PRAPARE - Administrator, Civil Service (Medical): No    Lack of Transportation (Non-Medical): No  Physical Activity: Sufficiently Active (04/25/2021)   Exercise Vital Sign    Days of Exercise per Week: 5 days    Minutes of Exercise per Session: 30 min  Stress: Stress Concern Present (04/25/2021)   [employer] of Occupational Health - Occupational Stress Questionnaire    Feeling of Stress : To some extent  Social Connections: Moderately Isolated (04/25/2021)   Social Connection and Isolation Panel [NHANES]    Frequency of Communication with Friends and Family: Once a week    Frequency of Social Gatherings with Friends and Family: Once a week    Attends Religious Services: Never    Database Administrator or Organizations: Yes    Attends Engineer, Structural: More than 4 times per year    Marital Status: Married  Catering Manager Violence: Not on file    Family History  Problem Relation Age of Onset   Hypertension Mother    Arthritis Father     Past Medical History:  Diagnosis Date   Allergy    Kidney stones    multiple stones passed as child, had lithotripsy once   Psoriasis     Patient Active Problem List   Diagnosis Date Noted   Psoriasis 09/07/2018   Nephrolithiasis 09/07/2018   Exposure to COVID-19 virus 09/07/2018    Past Surgical History:  Procedure Laterality Date   EXTRACORPOREAL SHOCK WAVE LITHOTRIPSY Left 12/12/2019   Procedure:  LEFT EXTRACORPOREAL SHOCK WAVE LITHOTRIPSY (ESWL);  Surgeon: Rosalind Zachary NOVAK, MD;  Location: [facility];  Service: Urology;  Laterality: Left;   LITHOTRIPSY     lymph node biospy     with splenic infection   PILONIDAL CYST EXCISION  2008   WISDOM TOOTH EXTRACTION      Current Outpatient Medications  Medication Sig Dispense Refill   acetaminophen  (TYLENOL ) 500 MG tablet Take 500 mg by mouth every 6 (six) hours as needed for headache.      aspirin  EC 81 MG tablet Take 1 tablet (81 mg total) by mouth daily. Swallow whole.     betamethasone dipropionate 0.05 % cream Apply 1 application topically 2 (two) times daily as needed.      finasteride (PROSCAR) 5 MG tablet Take 5 mg by mouth daily.     fluticasone (FLONASE) 50 MCG/ACT nasal spray Place 1 spray into both nostrils daily as needed for allergies or rhinitis.     Multiple Vitamin (MULTIVITAMIN WITH MINERALS) TABS tablet Take 1 tablet by mouth daily.     SKYRIZI PEN 150 MG/ML pen Inject 150 mg into the skin See admin instructions. Every 12 weeks     No current facility-administered medications for this visit.    Allergies as of 03/31/2023   (No Known Allergies)    Vitals: BP 128/83   Pulse 70   Ht 5' 8 (1.727 m)   Wt 165 lb (74.8 kg)   BMI 25.09 kg/m  Last Weight:  Wt Readings from Last 1 Encounters:  03/31/23 165 lb (74.8 kg)   Last Height:   Ht Readings from Last 1 Encounters:  03/31/23 5' 8 (1.727 m)     Physical exam: Exam: Gen: NAD, conversant, well nourised, well groomed                     CV: RRR, no MRG. No Carotid Bruits. No peripheral edema,  warm, nontender Eyes: Conjunctivae clear without exudates or hemorrhage  Neuro: Detailed Neurologic Exam  Speech:    Speech is normal; fluent and spontaneous with normal comprehension.  Cognition:    The patient is oriented to person, place, and time;     recent and remote memory intact;     language fluent;     normal attention, concentration,      fund of knowledge Cranial Nerves:    The pupils are equal, round, and reactive to light. The fundi are normal and spontaneous venous pulsations are present. Visual fields are full to finger confrontation. Extraocular movements are intact. Trigeminal sensation is intact and the muscles of mastication are normal. The face is symmetric. The palate elevates in the midline. Hearing intact. Voice is normal. Shoulder shrug is normal. The tongue has normal motion without fasciculations.   Coordination: nml  Gait: nml  Motor Observation:    No asymmetry, no atrophy, and no involuntary movements noted. Tone:    Normal muscle tone.    Posture:    Posture is normal. normal erect    Strength:    Strength is V/V in the upper and lower limbs.      Sensation: intact to LT     Reflex Exam:  DTR's:    Deep tendon reflexes in the upper and lower extremities are normal bilaterally.   Toes:    The toes are downgoing bilaterally.   Clonus:    Clonus is absent.    Assessment/Plan:  38 y.o. male here as requested by Ozell Heron HERO, MD for stroke-like episode. has Psoriasis; Nephrolithiasis; and Exposure to COVID-19 virus on their problem list.  Unusual episode of left-sided numbness. No hx of migraines. Cannot rule out TIA needs thorough evaluation however the Differential is  Migraine aura, less likely be a sensory seizre but can't rule it out, cervical spine pathology like a pinched nerve, TIA  Addendum 04/26/2023: Stroke-like episode. Bubble study with pfo. Will send to cardiology(VERY much appreciate my colleagues in cardiology) Please evaluate for TEE or any procedure per clinical evaluation. Echo 04/19/2023: Conclusion(s)/Recommendation(s): Consider a transesophageal echocardiogram to exclude cardiac source of embolism and furthe assess mitral valve   MRI brain: 03/03/2023 Normal CT Angiogram of the blood vessels head and neck -  imgaing Echocardiogram - cardiology 30-day  heart hear monitor - cardiology TCD bubble study for patent foramen ovale - [place] will call  Can consider a panel looking for abnormal clotting risk ASA 81mg  at least until we figure this out or workup comes back negative EEG here in the office  Orders Placed This Encounter  Procedures   CT ANGIO HEAD W OR WO CONTRAST   CT ANGIO NECK W OR WO CONTRAST   Lipid Panel   Hemoglobin A1c   Cardiac event monitor   ECHOCARDIOGRAM COMPLETE BUBBLE STUDY   EEG adult   VAS US  TRANSCRANIAL DOPPLER W BUBBLES   Meds ordered this encounter  Medications   aspirin  EC 81 MG tablet    Sig: Take 1 tablet (81 mg total) by mouth daily. Swallow whole.    Cc: Ozell Heron HERO, MD,  Ozell Heron HERO, MD  Onetha Epp, MD  [facility] [address] [place], [place] [address]  Phone [phone] Fax [phone]

## 2023-03-31 NOTE — Patient Instructions (Addendum)
 Differential: Migraine aura, less likely be a sensory seizre but can't rule it out, cervical spine pathology like a pinched nerve, TIA  CT Angiogram of the blood vessels head and neck - [facility] imgaing Echocardiogram - cardiology 30-day heart hear monitor - cardiology TCD bubble study for patent foramen ovale - [facility] will call  Can consider a panel looking for abnormal clotting risk ASA 81mg  at least until we figure this out or workup comes back negative EEG here in the office     Transient Ischemic Attack A transient ischemic attack (TIA) happens when blood supply to the brain is blocked temporarily. A TIA causes stroke-like symptoms that go away quickly without causing any permanent damage. Having a TIA can be considered a warning sign for a stroke and should not be ignored. A person who has a TIA is at higher risk for a stroke. What are the causes? This condition is caused by a temporary blockage in an artery in the head or neck. This means the brain does not get the blood supply it needs. A blockage can be caused by: Fatty buildup in an artery in the head or neck (atherosclerosis). A blood clot traveling from the heart. An artery tear (dissection). Inflammation of an artery (vasculitis). Sometimes the cause is not known. What increases the risk? Certain factors make you more likely to develop this condition. Some of these are things you can change, including: Using products that contain nicotine or tobacco. Being inactive. Heavy alcohol use. Drug use, especially cocaine and methamphetamine. Medical conditions that may increase your risk include: High blood pressure (hypertension). High cholesterol. Diabetes. Heart disease (coronary artery disease). An irregular heartbeat, also called atrial fibrillation (AFib). Sickle cell disease. Blood clotting disorders (hypercoagulable state). Other risk factors include: Being over the age of 59. Being male. Obesity. Sleep  problems such as sleep apnea. Family history of stroke. Previous history of blood clots, stroke, TIA, or heart attack. What are the signs or symptoms? Symptoms of a TIA are the same as those of a stroke. The symptoms develop suddenly, and then go away quickly. They may include: Dizziness, loss of balance and coordination, or trouble walking. Vision changes, such as double vision, blurred vision, or loss of vision. Weakness or numbness in your face, arm, or leg, especially on one side of your body. Trouble speaking, understanding speech, or both (aphasia). Nausea and vomiting. Severe headache. Confusion. If possible, note what time your symptoms started. Tell your health care provider. How is this diagnosed? This condition may be diagnosed based on: Your symptoms and medical history. A physical exam. Imaging tests, usually a CT scan or MRI of the brain. Blood tests. You may also have other tests, including: Electrocardiogram (ECG). Echocardiogram. Continuous heart monitoring. Carotid ultrasound. A scan of blood circulation in the brain (CT angiogram or MR angiogram). How is this treated? The goal of treatment is to reduce the risk for a stroke. Stroke prevention therapies may include: Changes to diet and lifestyle, such as being physically active and stopping smoking. Treating other health conditions, such as diabetes or AFib. Medicines to thin the blood (antiplatelets or anticoagulants). Blood pressure medicines. Medicines to reduce cholesterol. If testing shows a narrowing in the arteries to your brain, your health care provider may recommend a procedure, such as: Carotid endarterectomy. This is done to remove the blockage from your artery. Carotid angioplasty and stenting. This uses a small mesh tube (stent) to open or widen an artery in the neck. The stent helps  keep the artery open by supporting the artery walls. Follow these instructions at home: Medicines Take  over-the-counter and prescription medicines only as told by your health care provider. If you were told to take a medicine to thin your blood, such as aspirin  or an anticoagulant, use it exactly as told by your health care provider. Taking too much blood-thinning medicine can cause bleeding. Taking too little will not protect you against a stroke and other problems. Eating and drinking  Eat 5 or more servings of fruits and vegetables each day. Follow guidelines from your health care provider about your diet. You may need to follow a certain diet to help manage risk factors for stroke. This may include: Eating a low-fat, low-salt diet. Choosing high-fiber foods. Limiting carbohydrates and sugar. If you drink alcohol: Limit how much you have to: 0-1 drink a day for women who are not pregnant. 0-2 drinks a day for men. Know how much alcohol is in your drink. In the [place] one drink equals one 12 oz bottle of beer (355 mL), one 5 oz glass of wine (148 mL), or one 1 oz glass of hard liquor (44 mL). General instructions Maintain a healthy weight. Try to get at least 30 minutes of exercise on most days. Get treatment if you have sleep apnea. Do not use any products that contain nicotine or tobacco. These products include cigarettes, chewing tobacco, and vaping devices, such as e-cigarettes. If you need help quitting, ask your health care provider. Do not use illegal drugs. Keep all follow-up visits. Your health care provider will want to know if you have any more symptoms and to check blood labs if any medicines were prescribed. Where to find more information American Stroke Association: stroke.org Get help right away if: You have chest pain. You have fast or irregular heartbeats (palpitations). You have any symptoms of a stroke. BE FAST is an easy way to remember the main warning signs of a stroke. B - Balance. Signs are dizziness, sudden trouble walking, or loss of balance. E - Eyes. Signs  are trouble seeing or a sudden change in vision. F - Face. Signs are sudden weakness or numbness of the face, or the face or eyelid drooping on one side. A - Arms. Signs are weakness or numbness in an arm. This happens suddenly and usually on one side of the body. S - Speech.Signs are sudden trouble speaking, slurred speech, or trouble understanding what people say. T - Time. Time to call emergency services. Write down what time symptoms started. You have other signs of a stroke, such as: A sudden, severe headache with no known cause. Nausea or vomiting. Seizure. These symptoms may be an emergency. Get help right away. Call 911. Do not wait to see if the symptoms will go away. Do not drive yourself to the hospital. This information is not intended to replace advice given to you by your health care provider. Make sure you discuss any questions you have with your health care provider. Document Revised: 08/20/2021 Document Reviewed: 08/20/2021 Elsevier Patient Education  2024 Else[employer] Atrial Fibrillation Atrial fibrillation (AFib) is a type of irregular or rapid heartbeat (arrhythmia). In AFib, the top part of the heart (atria) beats in an irregular pattern. This makes the heart unable to pump blood normally and effectively. The goal of treatment is to prevent blood clots from forming, control your heart rate, or restore your heartbeat to a normal rhythm. If this condition is not treated, it can cause serious  problems, such as a weakened heart muscle (cardiomyopathy) or a stroke. What are the causes? This condition is often caused by medical conditions that damage the heart's electrical system. These include: High blood pressure (hypertension). This is the most common cause. Certain heart problems or conditions, such as heart failure, coronary artery disease, heart valve problems, or heart surgery. Diabetes. Overactive thyroid  (hyperthyroidism). Chronic kidney disease. Certain lung  conditions, such as emphysema, pneumonia, or COPD. Obstructive sleep apnea. In some cases, the cause of this condition is not known. What increases the risk? This condition is more likely to develop in: Older adults. Athletes who do endurance exercise. People who have a family history of AFib. Males. People who are Caucasian. People who are obese. People who smoke or misuse alcohol. What are the signs or symptoms? Symptoms of this condition include: Fast or irregular heartbeats (palpitations). Discomfort or pain in your chest. Shortness of breath. Sudden light-headedness or weakness. Tiring easily during exercise or activity. Syncope (fainting). Sweating. In some cases, there are no symptoms. How is this diagnosed? Your health care provider may detect AFib when taking your pulse. If detected, this condition may be diagnosed with: An electrocardiogram (ECG) to check electrical signals of the heart. An ambulatory cardiac monitor to record your heart's activity for a few days. A transthoracic echocardiogram (TTE) to create pictures of your heart. A transesophageal echocardiogram (TEE) to create even clearer pictures of your heart. A stress test to check your blood supply while you exercise. Imaging tests, such as a CT scan or chest X-ray. Blood tests. How is this treated? Treatment depends on underlying conditions and how you feel when you get AFib. This condition may be treated with: Medicines to prevent blood clots or to treat heart rate or heart rhythm problems. Electrical cardioversion to reset the heart's rhythm. A pacemaker to correct abnormal heart rhythm. Ablation to remove the heart tissue that sends abnormal signals. Left atrial appendage closure to seal the area where blood clots can form. In some cases, underlying conditions will be treated. Follow these instructions at home: Medicines Take over-the counter and prescription medicines only as told by your  provider. Do not take any new medicines without talking to your provider. If you are taking blood thinners: Talk with your provider before taking aspirin  or NSAIDs. These medicines can raise your risk of bleeding. Take your medicines as told. Take them at the same time each day. Do not do things that could hurt or bruise you. Be careful to avoid falls. Wear an alert bracelet or carry a card that says that you take blood thinners. Lifestyle Do not use any products that contain nicotine or tobacco. These products include cigarettes, chewing tobacco, and vaping devices, such as e-cigarettes. If you need help quitting, ask your provider. Eat heart-healthy foods. Talk with a food expert (dietitian) to make an eating plan that is right for you. Exercise regularly as told by your provider. Do not drink alcohol. Lose weight if you are overweight. General instructions If you have obstructive sleep apnea, manage your condition as told by your provider. Do not use diet pills unless your provider approves. Diet pills can make heart problems worse. Keep all follow-up visits. Your provider will want to check your heart rate and rhythm regularly. Contact a health care provider if: You notice a change in the rate, rhythm, or strength of your heartbeat. You are taking a blood thinner and you notice more bruising. You tire more easily when you exercise or do  heavy work. You have a sudden change in weight. Get help right away if:  You have chest pain. You have trouble breathing. You have side effects of blood thinners, such as blood in your vomit, poop (stool), or pee (urine), or bleeding that does not stop. You have any symptoms of a stroke. BE FAST is an easy way to remember the main warning signs of a stroke: B - Balance. Signs are dizziness, sudden trouble walking, or loss of balance. E - Eyes. Signs are trouble seeing or a sudden change in vision. F - Face. Signs are sudden weakness or numbness of  the face, or the face or eyelid drooping on one side. A - Arms. Signs are weakness or numbness in an arm. This happens suddenly and usually on one side of the body. S - Speech.Signs are sudden trouble speaking, slurred speech, or trouble understanding what people say. T - Time. Time to call emergency services. Write down what time symptoms started. Other signs of a stroke, such as: A sudden, severe headache with no known cause. Nausea or vomiting. Seizure. These symptoms may be an emergency. Get help right away. Call 911. Do not wait to see if the symptoms will go away. Do not drive yourself to the hospital. This information is not intended to replace advice given to you by your health care provider. Make sure you discuss any questions you have with your health care provider. Document Revised: 11/24/2021 Document Reviewed: 11/24/2021 Elsevier Patient Education  2024 Elsevier Inc.  Patent George Peterson, Adult  A foramen ovale is a hole between the upper chambers (right atrium and left atrium) of the heart. Before you are born, it is normal to have this hole in your heart. The hole allows blood to circulate through the body without having to go through the lungs. After your birth, when you are able to breathe, you do not need the foramen ovale and it usually closes. If the hole does not close, it is called a patent foramen ovale (PFO). PFO is a common condition. Most people do not know they have this hole, and they do not have any health problems caused by it. What are the causes? The cause of this condition is not known. What are the signs or symptoms? In most cases, there are no symptoms of this condition. Possible rare symptoms include: Stroke caused by a blood clot. Migraine headaches. Platypnea-orthodeoxia syndrome. This is a condition in which a person has shortness of breath and decreased oxygen when seated or standing but feels better when lying down. How is this diagnosed? This  condition may be diagnosed based on: A physical exam and your medical history. Echocardiogram. This test uses sound waves to produce images of the heart. Transesophageal echocardiogram (TEE). This type of echocardiogram is performed by placing a probe in the part of the body that moves food from the mouth to the stomach (esophagus). Electrocardiogram (ECG). This test identifies changes in the electrical activity of the heart. Cardiac MRI. This is an imaging technique that is used to visualize the heart, if further images are needed after TEE. How is this treated? Usually, no treatment is needed. If your condition is associated with symptoms or blood clots, you may need: Medicines to prevent blood clots and strokes (anticoagulant or antiplateletmedicines). A surgical procedure to close the hole (transcatheter closure). Follow these instructions at home: Take over-the-counter and prescription medicines only as told by your health care provider. Keep all follow-up visits. This is important. Contact  a health care provider if: You have a fever. You have frequent or severe headaches. Get help right away if: Your skin turns blue. You have chest pain or difficulty breathing. You have any symptoms of stroke. BE FAST is an easy way to remember the main warning signs of stroke: B - Balance. Signs are dizziness, sudden trouble walking, or loss of balance. E - Eyes. Signs are trouble seeing or a sudden change in vision. F - Face. Signs are sudden weakness or numbness of the face, or the face or eyelid drooping on one side. A - Arms. Signs are weakness or numbness in an arm. This happens suddenly and usually on one side of the body. S - Speech. Signs are sudden trouble speaking, slurred speech, or trouble understanding what people say. T - Time. Time to call emergency services. Write down what time symptoms started. You have other signs of a stroke, such as: A sudden, severe headache with no known  cause. Nausea or vomiting. Seizure. These symptoms may represent a serious problem that is an emergency. Do not wait to see if the symptoms will go away. Get medical help right away. Call your local emergency services (911 in the [place] Do not drive yourself to the hospital. Summary A patent foramen ovale is a hole between the upper chambers (right atrium and left atrium) of your heart. The cause of this condition is not known. You may not know that you have a hole in your heart, and you may not have any health problems from it. Usually, no treatment is needed for this condition unless you have symptoms or blood clots. This information is not intended to replace advice given to you by your health care provider. Make sure you discuss any questions you have with your health care provider. Document Revised: 01/09/2020 Document Reviewed: 01/09/2020 Elsevier Patient Education  2024 [employer].

## 2023-04-01 LAB — LIPID PANEL
Chol/HDL Ratio: 3 {ratio} (ref 0.0–5.0)
Cholesterol, Total: 143 mg/dL (ref 100–199)
HDL: 48 mg/dL (ref >39)
LDL Chol Calc (NIH): 81 mg/dL (ref 0–99)
Triglycerides: 70 mg/dL (ref 0–149)
VLDL Cholesterol Cal: 14 mg/dL (ref 5–40)

## 2023-04-01 LAB — HEMOGLOBIN A1C
Est. average glucose Bld gHb Est-mCnc: 97 mg/dL
Hgb A1c MFr Bld: 5 % (ref 4.8–5.6)

## 2023-04-03 ENCOUNTER — Other Ambulatory Visit: Payer: Self-pay | Admitting: Neurology

## 2023-04-03 ENCOUNTER — Telehealth: Payer: Self-pay | Admitting: Neurology

## 2023-04-03 ENCOUNTER — Encounter: Payer: Self-pay | Admitting: Neurology

## 2023-04-03 DIAGNOSIS — G459 Transient cerebral ischemic attack, unspecified: Secondary | ICD-10-CM

## 2023-04-03 DIAGNOSIS — G4089 Other seizures: Secondary | ICD-10-CM

## 2023-04-03 DIAGNOSIS — R200 Anesthesia of skin: Secondary | ICD-10-CM

## 2023-04-03 DIAGNOSIS — R002 Palpitations: Secondary | ICD-10-CM

## 2023-04-03 DIAGNOSIS — G43109 Migraine with aura, not intractable, without status migrainosus: Secondary | ICD-10-CM

## 2023-04-03 MED ORDER — ASPIRIN 81 MG PO TBEC: 81.0000 mg | DELAYED_RELEASE_TABLET | Freq: Every day | ORAL | Status: DC

## 2023-04-03 NOTE — Telephone Encounter (Signed)
 [facility] NPR case #[id number] sent to GI [phone]

## 2023-04-07 ENCOUNTER — Encounter (HOSPITAL_COMMUNITY)
Admission: RE | Admit: 2023-04-07 | Discharge: 2023-04-07 | Disposition: A | Discharge status: Home / Self Care | Payer: 59 | Source: Ambulatory Visit | Attending: Family Medicine | Admitting: Family Medicine

## 2023-04-07 DIAGNOSIS — R140 Abdominal distension (gaseous): Secondary | ICD-10-CM | POA: Diagnosis present

## 2023-04-07 DIAGNOSIS — R110 Nausea: Secondary | ICD-10-CM | POA: Insufficient documentation

## 2023-04-07 MED ORDER — TECHNETIUM TC 99M MEBROFENIN IV KIT
5.5000 | PACK | Freq: Once | INTRAVENOUS | Status: AC
  Administered 2023-04-07: 5.5 via INTRAVENOUS

## 2023-04-09 DIAGNOSIS — R002 Palpitations: Secondary | ICD-10-CM | POA: Diagnosis not present

## 2023-04-09 DIAGNOSIS — G459 Transient cerebral ischemic attack, unspecified: Secondary | ICD-10-CM

## 2023-04-12 ENCOUNTER — Encounter: Payer: Self-pay | Admitting: Family Medicine

## 2023-04-12 DIAGNOSIS — R110 Nausea: Secondary | ICD-10-CM

## 2023-04-12 DIAGNOSIS — R109 Unspecified abdominal pain: Secondary | ICD-10-CM

## 2023-04-14 NOTE — Telephone Encounter (Signed)
Noted

## 2023-04-14 NOTE — Telephone Encounter (Signed)
No. Referral sent to Dr.Armbruster.

## 2023-04-14 NOTE — Telephone Encounter (Signed)
Ok to send referral to GI for chronic nausea and abdominal pain-- unknown cause

## 2023-04-17 ENCOUNTER — Ambulatory Visit
Admission: RE | Admit: 2023-04-17 | Discharge: 2023-04-17 | Disposition: A | Discharge status: Home / Self Care | Payer: 59 | Source: Ambulatory Visit | Attending: Neurology

## 2023-04-17 DIAGNOSIS — R200 Anesthesia of skin: Secondary | ICD-10-CM

## 2023-04-17 DIAGNOSIS — G459 Transient cerebral ischemic attack, unspecified: Secondary | ICD-10-CM

## 2023-04-17 DIAGNOSIS — R202 Paresthesia of skin: Secondary | ICD-10-CM

## 2023-04-17 MED ORDER — IOPAMIDOL (ISOVUE-370) INJECTION 76%
75.0000 mL | Freq: Once | INTRAVENOUS | Status: AC | PRN
  Administered 2023-04-17: 75 mL via INTRAVENOUS

## 2023-04-19 ENCOUNTER — Ambulatory Visit (HOSPITAL_COMMUNITY)
Admission: RE | Admit: 2023-04-19 | Discharge: 2023-04-19 | Disposition: A | Discharge status: Home / Self Care | Payer: 59 | Source: Ambulatory Visit | Attending: Neurology | Admitting: Neurology

## 2023-04-19 ENCOUNTER — Ambulatory Visit (HOSPITAL_COMMUNITY)
Admission: RE | Admit: 2023-04-19 | Discharge: 2023-04-19 | Disposition: A | Discharge status: Home / Self Care | Payer: 59 | Source: Ambulatory Visit | Attending: Neurology

## 2023-04-19 DIAGNOSIS — G459 Transient cerebral ischemic attack, unspecified: Secondary | ICD-10-CM | POA: Diagnosis not present

## 2023-04-19 DIAGNOSIS — R200 Anesthesia of skin: Secondary | ICD-10-CM | POA: Diagnosis not present

## 2023-04-19 DIAGNOSIS — R202 Paresthesia of skin: Secondary | ICD-10-CM | POA: Insufficient documentation

## 2023-04-19 LAB — ECHOCARDIOGRAM COMPLETE BUBBLE STUDY
Area-P 1/2: 4.68 cm2
S' Lateral: 2.9 cm

## 2023-04-19 NOTE — Progress Notes (Signed)
TCD Bubble study completed. Please see CV Procedures for preliminary results.  Shona Simpson, RVT 04/19/23 2:25 PM

## 2023-04-19 NOTE — Progress Notes (Signed)
  Echocardiogram 2D Echocardiogram has been performed.  Delcie Roch 04/19/2023, 1:37 PM

## 2023-04-20 ENCOUNTER — Telehealth: Payer: Self-pay | Admitting: Neurology

## 2023-04-20 NOTE — Telephone Encounter (Signed)
The echocardiogramverall looked really good, but the cardiologist did recommenned a TEE (a test that looks closer at the heart; a transesophageal echocardiogram) to exclude cardiac source of embolism and furthe assess mitral valve. If he approves let me know and I can place referral thanks!

## 2023-04-20 NOTE — Telephone Encounter (Signed)
Also received this from Dr Lucia Gaskins:  George Fret, MD to [facility] Results     04/20/23  2:01 PM Result Note The transcranial doppler on his heart was normal/negative great news, thanks

## 2023-04-24 ENCOUNTER — Telehealth: Payer: Self-pay | Admitting: Neurology

## 2023-04-24 ENCOUNTER — Encounter: Payer: Self-pay | Admitting: Neurology

## 2023-04-24 NOTE — Telephone Encounter (Signed)
Will you call patient and let him know that looks like they saw a PFO and something on the mitral valve(may not be anything) but they recommend a transesophageal echocardiogram to exclude cardiac source of embolism and furthe assess mitral valve if ok with him, let me know and we can order thanks  Fyi; Dr. Casimiro Needle

## 2023-04-25 ENCOUNTER — Ambulatory Visit: Payer: 59 | Admitting: Neurology

## 2023-04-25 DIAGNOSIS — G43109 Migraine with aura, not intractable, without status migrainosus: Secondary | ICD-10-CM

## 2023-04-25 DIAGNOSIS — G4089 Other seizures: Secondary | ICD-10-CM

## 2023-04-25 DIAGNOSIS — G459 Transient cerebral ischemic attack, unspecified: Secondary | ICD-10-CM

## 2023-04-25 NOTE — Telephone Encounter (Signed)
See other phone note

## 2023-04-25 NOTE — Procedures (Signed)
   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 Electrical activity was reviewed with band pass filter of 1-70Hz , sensitivity of 7 uV/mm, display speed of 71mm/sec with a 60Hz  notched filter applied as appropriate. EEG data were recorded continuously and digitally stored.   Description of the recording: The background rhythms of this recording consists of a fairly well modulated medium amplitude background activity of 10 Hz. As the record progresses, the patient initially is in the waking state, but appears to enter the early stage II sleep during the recording, with rudimentary sleep spindles and vertex sharp wave activity seen. During the wakeful state, photic stimulation was performed, and no abnormal responses were seen. Hyperventilation was also performed, no abnormal response seen. No epileptiform discharges seen during this recording. There was no focal slowing.   Abnormality: None   Impression: This is a normal awake and sleep EEG. No evidence of interictal epileptiform discharges. Normal EEGs, however, do not rule out epilepsy.    Kawanna Christley, MD [facility]

## 2023-04-25 NOTE — Telephone Encounter (Signed)
 Called pt and LVM (ok per DPR) asking for call back. Need to know if he is ok with ordering TEE due to findings on echo. Need to look closer at mitral valve and evaluate for cardiac source of embolism. He can also let us  know when he comes in today for his EEG.

## 2023-04-25 NOTE — Telephone Encounter (Signed)
 Pt is here for EEG.  I gave him the results for the [facility], per Dr. Ines she said it looks like they saw a PFO (opening) and something on the mitral valve(may not be anything) but they recommend a transesophageal echocardiogram to exclude cardiac source of embolism and further assess mitral valve.  Pt verbalized understanding and did want to proceed.  He appreciated the results.

## 2023-04-26 ENCOUNTER — Encounter: Payer: Self-pay | Admitting: Neurology

## 2023-04-26 ENCOUNTER — Other Ambulatory Visit: Payer: Self-pay | Admitting: Neurology

## 2023-04-26 DIAGNOSIS — Q2112 Patent foramen ovale: Secondary | ICD-10-CM

## 2023-04-26 DIAGNOSIS — R2990 Unspecified symptoms and signs involving the nervous system: Secondary | ICD-10-CM

## 2023-04-27 ENCOUNTER — Encounter: Payer: Self-pay | Admitting: Neurology

## 2023-05-05 ENCOUNTER — Encounter: Payer: Self-pay | Admitting: Gastroenterology

## 2023-05-09 ENCOUNTER — Ambulatory Visit: Payer: 59 | Attending: Neurology

## 2023-05-09 DIAGNOSIS — R002 Palpitations: Secondary | ICD-10-CM

## 2023-05-09 DIAGNOSIS — G459 Transient cerebral ischemic attack, unspecified: Secondary | ICD-10-CM

## 2023-05-11 ENCOUNTER — Encounter: Payer: Self-pay | Admitting: Neurology

## 2023-06-19 ENCOUNTER — Encounter: Payer: Self-pay | Admitting: Gastroenterology

## 2023-06-19 ENCOUNTER — Ambulatory Visit: Payer: 59 | Admitting: Gastroenterology

## 2023-06-19 ENCOUNTER — Other Ambulatory Visit: Payer: Self-pay | Admitting: Gastroenterology

## 2023-06-19 VITALS — BP 122/80 | HR 80 | Ht 67.5 in | Wt 163.1 lb

## 2023-06-19 DIAGNOSIS — K594 Anal spasm: Secondary | ICD-10-CM

## 2023-06-19 DIAGNOSIS — R110 Nausea: Secondary | ICD-10-CM | POA: Diagnosis not present

## 2023-06-19 DIAGNOSIS — R140 Abdominal distension (gaseous): Secondary | ICD-10-CM

## 2023-06-19 DIAGNOSIS — R1310 Dysphagia, unspecified: Secondary | ICD-10-CM

## 2023-06-19 DIAGNOSIS — K219 Gastro-esophageal reflux disease without esophagitis: Secondary | ICD-10-CM

## 2023-06-19 DIAGNOSIS — K828 Other specified diseases of gallbladder: Secondary | ICD-10-CM

## 2023-06-19 MED ORDER — OMEPRAZOLE 20 MG PO CPDR: 20.0000 mg | DELAYED_RELEASE_CAPSULE | Freq: Every day | ORAL | 2 refills | Status: DC

## 2023-06-19 NOTE — Progress Notes (Signed)
 Chief Complaint: Upper GI symptoms Primary GI MD: Gentry Fitz  HPI: 38 year old male with medical history as listed below presents for evaluation of upper GI symptoms  Recently seen by PCP for gas/nausea pain occurring with meals.  HIDA scan January 2025 with EF 34%.  Patient was seen in ED December 2024 with an episode of left-sided numbness with imaging negative for CVA thought to maybe have may have TIA versus sensory seizure.  Currently being worked up by nephrology recently follow-up EEG.  Echocardiogram with adequate ejection fraction.  He also underwent a bubble study for patent foramen ovale.  And had 30-day heart monitor with cardiac.  All of this workup has been negative..  CBC, CMP normal.  He is scheduled for TEE in May  Discussed the use of AI scribe software for clinical note transcription with the patient, who gave verbal consent to proceed.  He experiences irregular and inconsistent nausea and abdominal discomfort, described as a bloating sensation rather than sharp pain. The nausea is characterized as a 'sickness, uncomfortable nausea' without an imminent need to vomit. Symptoms are inconsistent, sometimes triggered by eating, but not linked to specific foods or meal sizes. Attempts to identify triggers such as certain breads, pastas, or sauces have been unsuccessful. No regular heartburn, weight loss, black stools, changes in bowel habits, or bloody stools, though occasional heartburn occurs after spicy foods.  He reports difficulty swallowing solids, with a sensation of food getting stuck in the mid-chest area. This occurs less frequently now due to increased water intake during meals. He is not on any regular medication for these symptoms but takes aspirin daily and uses NSAIDs like ibuprofen as needed.  He experiences infrequent painful spasms in the anus, occurring about once a month, without any clear triggers or associated discharge.     Past Medical History:  Diagnosis  Date   Allergy    Kidney stones    multiple stones passed as child, had lithotripsy once   Psoriasis     Past Surgical History:  Procedure Laterality Date   EXTRACORPOREAL SHOCK WAVE LITHOTRIPSY Left 12/12/2019   Procedure: LEFT EXTRACORPOREAL SHOCK WAVE LITHOTRIPSY (ESWL);  Surgeon: Belva Agee, MD;  Location: [facility];  Service: Urology;  Laterality: Left;   LITHOTRIPSY  2007   lymph node biospy     with splenic infection   PILONIDAL CYST EXCISION  2008   WISDOM TOOTH EXTRACTION      Current Outpatient Medications  Medication Sig Dispense Refill   acetaminophen (TYLENOL) 500 MG tablet Take 500 mg by mouth every 6 (six) hours as needed for headache.      aspirin EC 81 MG tablet Take 1 tablet (81 mg total) by mouth daily. Swallow whole.     betamethasone dipropionate 0.05 % cream Apply 1 application topically 2 (two) times daily as needed.      finasteride (PROPECIA) 1 MG tablet Take 1 mg by mouth daily.     fluticasone (FLONASE) 50 MCG/ACT nasal spray Place 1 spray into both nostrils daily as needed for allergies or rhinitis.     Multiple Vitamin (MULTIVITAMIN WITH MINERALS) TABS tablet Take 1 tablet by mouth daily.     SKYRIZI PEN 150 MG/ML pen Inject 150 mg into the skin See admin instructions. Every 12 weeks     No current facility-administered medications for this visit.    Allergies as of 06/19/2023   (No Known Allergies)    Family History  Problem Relation Age of Onset  Hypertension Mother    Hyperlipidemia Mother    Colon polyps Mother    Skin cancer Mother    Arthritis Father    Kidney Stones Father    ALS Father    AAA (abdominal aortic aneurysm) Maternal Grandfather    Heart disease Paternal Grandfather     Social History   Socioeconomic History   Marital status: Married    Spouse name: Not on file   Number of children: 0   Years of education: Not on file   Highest education level: Bachelor's degree (e.g., BA, AB, BS)   Occupational History   Occupation: IT  Tobacco Use   Smoking status: Never   Smokeless tobacco: Never  Vaping Use   Vaping status: Never Used  Substance and Sexual Activity   Alcohol use: Yes    Comment: occ, 2-4 drinks a few times per month   Drug use: No   Sexual activity: Yes  Other Topics Concern   Not on file  Social History Narrative   Work or School: full time Museum/gallery curator      Home Situation: living with wife      Spiritual Beliefs: none      Lifestyle: goes to the gym 6 x per week and running, healthy diet            Social Drivers of Health   Financial Resource Strain: Medium Risk (04/25/2021)   Overall Financial Resource Strain (CARDIA)    Difficulty of Paying Living Expenses: Somewhat hard  Food Insecurity: No Food Insecurity (04/25/2021)   Hunger Vital Sign    Worried About Running Out of Food in the Last Year: Never true    Ran Out of Food in the Last Year: Never true  Transportation Needs: No Transportation Needs (04/25/2021)   PRAPARE - Administrator, Civil Service (Medical): No    Lack of Transportation (Non-Medical): No  Physical Activity: Sufficiently Active (04/25/2021)   Exercise Vital Sign    Days of Exercise per Week: 5 days    Minutes of Exercise per Session: 30 min  Stress: Stress Concern Present (04/25/2021)   [employer] of Occupational Health - Occupational Stress Questionnaire    Feeling of Stress : To some extent  Social Connections: Moderately Isolated (04/25/2021)   Social Connection and Isolation Panel [NHANES]    Frequency of Communication with Friends and Family: Once a week    Frequency of Social Gatherings with Friends and Family: Once a week    Attends Religious Services: Never    Database administrator or Organizations: Yes    Attends Engineer, structural: More than 4 times per year    Marital Status: Married  Catering manager Violence: Not on file    Review of Systems:    Constitutional: No weight  loss, fever, chills, weakness or fatigue HEENT: Eyes: No change in vision               Ears, Nose, Throat:  No change in hearing or congestion Skin: No rash or itching Cardiovascular: No chest pain, chest pressure or palpitations   Respiratory: No SOB or cough Gastrointestinal: See HPI and otherwise negative Genitourinary: No dysuria or change in urinary frequency Neurological: No headache, dizziness or syncope Musculoskeletal: No new muscle or joint pain Hematologic: No bleeding or bruising Psychiatric: No history of depression or anxiety    Physical Exam:  Vital signs: BP 122/80 (BP Location: Left Arm, Patient Position: Sitting, Cuff Size: Normal)  Pulse 80   Ht 5' 7.5" (1.715 m) Comment: height measured without shoes  Wt 163 lb 2 oz (74 kg)   BMI 25.17 kg/m   Constitutional: NAD, Well developed, Well nourished, alert and cooperative Head:  Normocephalic and atraumatic. Eyes:   PEERL, EOMI. No icterus. Conjunctiva pink. Respiratory: Respirations even and unlabored. Lungs clear to auscultation bilaterally.   No wheezes, crackles, or rhonchi.  Cardiovascular:  Regular rate and rhythm. No peripheral edema, cyanosis or pallor.  Gastrointestinal:  Soft, nondistended, nontender. No rebound or guarding. Normal bowel sounds. No appreciable masses or hepatomegaly. Rectal:  Not performed.  Msk:  Symmetrical without gross deformities. Without edema, no deformity or joint abnormality.  Neurologic:  Alert and  oriented x4;  grossly normal neurologically.  Skin:   Dry and intact without significant lesions or rashes. Psychiatric: Oriented to person, place and time. Demonstrates good judgement and reason without abnormal affect or behaviors.  RELEVANT LABS AND IMAGING: CBC    Component Value Date/Time   WBC 6.1 03/02/2023 1830   RBC 4.95 03/02/2023 1830   HGB 14.7 03/02/2023 1830   HCT 41.6 03/02/2023 1830   PLT 189 03/02/2023 1830   MCV 84.0 03/02/2023 1830   MCH 29.7 03/02/2023  1830   MCHC 35.3 03/02/2023 1830   RDW 12.1 03/02/2023 1830   LYMPHSABS 2.1 03/02/2023 1830   MONOABS 0.4 03/02/2023 1830   EOSABS 0.1 03/02/2023 1830   BASOSABS 0.0 03/02/2023 1830    CMP     Component Value Date/Time   NA 141 03/02/2023 1830   K 3.7 03/02/2023 1830   CL 105 03/02/2023 1830   CO2 28 03/02/2023 1830   GLUCOSE 103 (H) 03/02/2023 1830   BUN 7 03/02/2023 1830   CREATININE 0.77 03/02/2023 1830   CALCIUM 9.6 03/02/2023 1830   PROT 7.5 03/02/2023 1830   ALBUMIN 4.8 03/02/2023 1830   AST 21 03/02/2023 1830   ALT 22 03/02/2023 1830   ALKPHOS 53 03/02/2023 1830   BILITOT 0.8 03/02/2023 1830   GFRNONAA >60 03/02/2023 1830   GFRAA >60 12/12/2019 1914     Assessment/Plan:   GERD Nausea Bloating Intermittent nausea and abdominal discomfort, described as bloating and sickness, with no clear food triggers.  Aspirin use daily.  HIDA scan with EF 34%.  DDx includes biliary dyskinesia, gastritis, esophagitis, PUD.  With addition of dysphagia it is reasonable to pursue endoscopy.  If no improvement on antacid therapy and no findings on endoscopy would recommend refer to [facility] for symptomatic biliary dyskinesia with borderline EF of 34%. - Start omeprazole 20 mg once daily -Schedule EGD - I thoroughly discussed the procedure with the patient (at bedside) to include nature of the procedure, alternatives, benefits, and risks (including but not limited to bleeding, infection, perforation, anesthesia/cardiac pulmonary complications).  Patient verbalized understanding and gave verbal consent to proceed with procedure.  - Although he has had a extensive negative workup with neurology and cardiology we will get clearance just in case - Advise dietary modifications: avoid sauces, pasta, tomatoes, citrus, and avoid lying flat after eating  Dysphagia Difficulty swallowing solids, with sensation of food getting stuck in the mid-chest. Symptoms are managed by drinking water. Differential  includes esophageal narrowing or globus sensation - Schedule endoscopy to evaluate for esophageal narrowing and perform dilation if necessary  Symptomatic Biliary Dyskinesia Gallbladder ejection fraction at 34%, indicating suboptimal function.  If unrevealing EGD and no improvement on antacid will refer to [facility]. - Monitor symptoms after starting omeprazole and  endoscopy - Consider surgical referral if symptoms persist after medical management  Proctalgia Fugax Infrequent painful spasms in the anus, occurring about once a month, with no clear triggers or associated symptoms.  Discussed medication versus colonoscopy.  Patient would like to hold off on both for now.  If becomes more frequent he will let us know  Assigned to Dr. Tomasa Rand today  Boone Master, PA-C [facility] 06/19/2023, 2:12 PM  Cc: Karie Georges, MD

## 2023-06-19 NOTE — Patient Instructions (Signed)
 We have sent the following medications to your pharmacy for you to pick up at your convenience: Omeprazole 20 mg daily  You have been scheduled for an endoscopy. Please follow written instructions given to you at your visit today.  If you use inhalers (even only as needed), please bring them with you on the day of your procedure.  If you take any of the following medications, they will need to be adjusted prior to your procedure:   DO NOT TAKE 7 DAYS PRIOR TO TEST- Trulicity (dulaglutide) Ozempic, Wegovy (semaglutide) Mounjaro (tirzepatide) Bydureon Bcise (exanatide extended release)  DO NOT TAKE 1 DAY PRIOR TO YOUR TEST Rybelsus (semaglutide) Adlyxin (lixisenatide) Victoza (liraglutide) Byetta (exanatide) ___________________________________________________________________________  Due to recent changes in healthcare laws, you may see the results of your imaging and laboratory studies on MyChart before your provider has had a chance to review them.  We understand that in some cases there may be results that are confusing or concerning to you. Not all laboratory results come back in the same time frame and the provider may be waiting for multiple results in order to interpret others.  Please give [place] 48 hours in order for your provider to thoroughly review all the results before contacting the office for clarification of your results.  _______________________________________________________  If your blood pressure at your visit was 140/90 or greater, please contact your primary care physician to follow up on this.  _______________________________________________________  If you are age 42 or older, your body mass index should be between 23-30. Your Body mass index is 25.17 kg/m. If this is out of the aforementioned range listed, please consider follow up with your Primary Care Provider.  If you are age 16 or younger, your body mass index should be between 19-25. Your Body mass index is  25.17 kg/m. If this is out of the aformentioned range listed, please consider follow up with your Primary Care Provider.   ________________________________________________________  The [facility] providers would like to encourage you to use [facility] to communicate with providers for non-urgent requests or questions.  Due to long hold times on the telephone, sending your provider a message by [facility] may be a faster and more efficient way to get a response.  Please allow 48 business hours for a response.  Please remember that this is for non-urgent requests.  _______________________________________________________

## 2023-06-20 ENCOUNTER — Telehealth: Payer: Self-pay

## 2023-06-20 NOTE — Telephone Encounter (Signed)
 [facility] [address] [place], [place]  [address] Phone:  [phone]   Fax:  [phone]   Nicolae Vasek DOB: [birth year] MRN: [id number]  Dear:Dr. Lucia Gaskins :   The patient above is schedule for a upper endoscopy in the near future under general anesthesia (Propofol).    Please fax or route a note of Medical Clearance to [phone], Attn: Annabell Howells  Please advise if this patient will require an office visit or further medical work-up before clearance can be given.   Thank you,   Annabell Howells, [facility]

## 2023-06-20 NOTE — Telephone Encounter (Signed)
 [facility] Pre-operative Risk Assessment     Request for surgical clearance:     Endoscopy Procedure  What type of surgery is being performed?     Upper Endoscopy  When is this surgery scheduled?     06/30/23  What type of clearance is required ?   Cardiac  Practice name and name of physician performing surgery?      [facility]  What is your office phone and fax number?      Phone- [phone]  Fax- [phone]  Anesthesia type (None, local, MAC, general) ?       MAC   Please route your response to [employer]

## 2023-06-20 NOTE — Telephone Encounter (Signed)
   Name: George Peterson  DOB: December 08, 1985  MRN: [id number]  Primary Cardiologist: None  Chart reviewed as part of pre-operative protocol coverage. Because of Kolbee Bogusz past medical history and time since last visit, he will require a follow-up in-office visit in order to better assess preoperative cardiovascular risk.  Pre-op covering staff: - Please schedule appointment and call patient to inform them. If patient already had an upcoming appointment within acceptable timeframe, please add "pre-op clearance" to the appointment notes so provider is aware. - Please contact requesting surgeon's office via preferred method (i.e, phone, fax) to inform them of need for appointment prior to surgery.  Would prefer to continue ASA throughout procedure.  Sharlene Dory, PA-C  06/20/2023, 10:37 AM

## 2023-06-21 ENCOUNTER — Telehealth: Payer: Self-pay | Admitting: Gastroenterology

## 2023-06-21 NOTE — Telephone Encounter (Signed)
 Patient returning call. Please advise

## 2023-06-21 NOTE — Telephone Encounter (Signed)
 During workup a patent foramen ovale was found so at this point I have to defer to cardiology for clearance. I see that cardiology has requested a follow up appointment, thanks

## 2023-06-22 NOTE — Telephone Encounter (Signed)
 Patient is returning phone call. Please advise, thank you.

## 2023-06-23 NOTE — Telephone Encounter (Signed)
 Patient called and stated that he was returning a call back to [place]. Patient is requesting a call back. Please advise.

## 2023-06-26 ENCOUNTER — Encounter: Payer: Self-pay | Admitting: Gastroenterology

## 2023-06-26 NOTE — Telephone Encounter (Signed)
 Patient called again to follow up on your call this morning. Requested a call back. Thanks

## 2023-06-27 NOTE — Progress Notes (Signed)
 Agree with the assessment and plan as outlined by Boone Master, PA-C.

## 2023-06-28 ENCOUNTER — Ambulatory Visit: Attending: Cardiovascular Disease | Admitting: Cardiovascular Disease

## 2023-06-28 ENCOUNTER — Other Ambulatory Visit: Payer: Self-pay | Admitting: Cardiovascular Disease

## 2023-06-28 ENCOUNTER — Encounter: Payer: Self-pay | Admitting: Cardiovascular Disease

## 2023-06-28 VITALS — BP 138/88 | HR 68 | Ht 68.0 in | Wt 167.0 lb

## 2023-06-28 DIAGNOSIS — Z01810 Encounter for preprocedural cardiovascular examination: Secondary | ICD-10-CM | POA: Diagnosis not present

## 2023-06-28 DIAGNOSIS — G459 Transient cerebral ischemic attack, unspecified: Secondary | ICD-10-CM

## 2023-06-28 DIAGNOSIS — Z01812 Encounter for preprocedural laboratory examination: Secondary | ICD-10-CM

## 2023-06-28 LAB — CBC

## 2023-06-28 NOTE — Progress Notes (Signed)
 06/28/2023 George Peterson   [birth year]  [id number]  Primary Physician George Georges, MD Primary Cardiologist: George Gess MD George Peterson, George Peterson  HPI:  George Peterson is a 38 y.o. thin-appearing married Caucasian male with no children who works in IT and was referred by his neurologist, Dr. Lucia Peterson, for cardiovascular valuation of potential TIA with TEE.  He he is fairly active, exercises on a stationary bike and walks on a daily basis several miles.  He has no other cardiovascular risk factors.  He presented on 03/02/2023 with left-sided numbness to the ER which resolved in 6 to 7 hours.  Workup was unrevealing.  MRI, CT CTA of the head neck showed no abnormalities.  He did see neurology who did transcranial Doppler study which was negative.  He had a event monitor that showed no evidence of A-fib and a transthoracic echo performed 04/19/2023 which was essentially normal except there was a suggestion of intra pulmonary shunting.  A TEE was recommended.  He has had no subsequent episodes.  The neurologist also question the possibility of this being an atypical migraine.   Current Meds  Medication Sig   acetaminophen (TYLENOL) 500 MG tablet Take 500 mg by mouth every 6 (six) hours as needed for headache.    aspirin EC 81 MG tablet Take 1 tablet (81 mg total) by mouth daily. Swallow whole.   betamethasone dipropionate 0.05 % cream Apply 1 application topically 2 (two) times daily as needed.    finasteride (PROPECIA) 1 MG tablet Take 1 mg by mouth daily.   fluticasone (FLONASE) 50 MCG/ACT nasal spray Place 1 spray into both nostrils daily as needed for allergies or rhinitis.   ibuprofen (ADVIL) 600 MG tablet Take 600 mg by mouth 4 (four) times daily.   Multiple Vitamin (MULTIVITAMIN WITH MINERALS) TABS tablet Take 1 tablet by mouth daily.   omeprazole (PRILOSEC) 20 MG capsule TAKE 1 CAPSULE BY MOUTH EVERY DAY   SKYRIZI PEN 150 MG/ML pen Inject 150 mg into the skin See admin  instructions. Every 12 weeks     No Known Allergies  Social History   Socioeconomic History   Marital status: Married    Spouse name: Not on file   Number of children: 0   Years of education: Not on file   Highest education level: Bachelor's degree (e.g., BA, AB, BS)  Occupational History   Occupation: IT  Tobacco Use   Smoking status: Never   Smokeless tobacco: Never  Vaping Use   Vaping status: Never Used  Substance and Sexual Activity   Alcohol use: Yes    Comment: occ, 2-4 drinks a few times per month   Drug use: No   Sexual activity: Yes  Other Topics Concern   Not on file  Social History Narrative   Work or School: full time Museum/gallery curator      Home Situation: living with wife      Spiritual Beliefs: none      Lifestyle: goes to the gym 6 x per week and running, healthy diet            Social Drivers of Health   Financial Resource Strain: Medium Risk (04/25/2021)   Overall Financial Resource Strain (CARDIA)    Difficulty of Paying Living Expenses: Somewhat hard  Food Insecurity: No Food Insecurity (04/25/2021)   Hunger Vital Sign    Worried About Running Out of Food in the Last Year: Never true    Ran Out of  Food in the Last Year: Never true  Transportation Needs: No Transportation Needs (04/25/2021)   PRAPARE - Administrator, Civil Service (Medical): No    Lack of Transportation (Non-Medical): No  Physical Activity: Sufficiently Active (04/25/2021)   Exercise Vital Sign    Days of Exercise per Week: 5 days    Minutes of Exercise per Session: 30 min  Stress: Stress Concern Present (04/25/2021)   [employer] of Occupational Health - Occupational Stress Questionnaire    Feeling of Stress : To some extent  Social Connections: Moderately Isolated (04/25/2021)   Social Connection and Isolation Panel [NHANES]    Frequency of Communication with Friends and Family: Once a week    Frequency of Social Gatherings with Friends and Family: Once a week     Attends Religious Services: Never    Database administrator or Organizations: Yes    Attends Engineer, structural: More than 4 times per year    Marital Status: Married  Catering manager Violence: Not on file     Review of Systems: General: negative for chills, fever, night sweats or weight changes.  Cardiovascular: negative for chest pain, dyspnea on exertion, edema, orthopnea, palpitations, paroxysmal nocturnal dyspnea or shortness of breath Dermatological: negative for rash Respiratory: negative for cough or wheezing Urologic: negative for hematuria Abdominal: negative for nausea, vomiting, diarrhea, bright red blood per rectum, melena, or hematemesis Neurologic: negative for visual changes, syncope, or dizziness All other systems reviewed and are otherwise negative except as noted above.    Blood pressure 138/88, pulse 68, height 5\' 8"  (1.727 m), weight 167 lb (75.8 kg), SpO2 99%.  General appearance: alert and no distress Neck: no adenopathy, no carotid bruit, no JVD, supple, symmetrical, trachea midline, and thyroid not enlarged, symmetric, no tenderness/mass/nodules Lungs: clear to auscultation bilaterally Heart: regular rate and rhythm, S1, S2 normal, no murmur, click, rub or gallop Extremities: extremities normal, atraumatic, no cyanosis or edema Pulses: 2+ and symmetric Skin: Skin color, texture, turgor normal. No rashes or lesions Neurologic: Grossly normal  EKG EKG Interpretation Date/Time:  Wednesday June 28 2023 09:17:52 EDT Ventricular Rate:  68 PR Interval:  138 QRS Duration:  90 QT Interval:  366 QTC Calculation: 389 R Axis:   26  Text Interpretation: Normal sinus rhythm with sinus arrhythmia Normal ECG When compared with ECG of 14-Sep-2020 23:15, PREVIOUS ECG IS PRESENT Confirmed by Nanetta Batty [phone]) on 06/28/2023 9:37:01 AM    ASSESSMENT AND PLAN:   TIA (transient ischemic attack) George Peterson was referred by his neurologist, Dr. Lucia Peterson, for  cardiac evaluation because of potential "TIA".  He has no cardiac risk factors.  He was evaluated for palpitations back in 2022.  He was seen in the ER 03/02/2023 with left-sided numbness.  Evaluation was unrevealing.  MRI, and CTA showed no abnormalities.  He subsequently had an event monitor that showed no evidence of A-fib and a 2D echo that raised the possibility of an intra atrial shunt with abnormal bubble study.  Transcranial Doppler was negative.  I believe he would benefit from having a TEE to further evaluate his interatrial septum, his mitral valve and potential for shunting.  I suspect this is going to be negative.  If it is negative, no further workup is necessary for our point of view and he will be cleared for his upcoming EGD.     George Gess MD FACP,FACC,FAHA, [facility] 06/28/2023 9:51 AM

## 2023-06-28 NOTE — Assessment & Plan Note (Signed)
 George Peterson was referred by his neurologist, Dr. Lucia Gaskins, for cardiac evaluation because of potential "TIA".  He has no cardiac risk factors.  He was evaluated for palpitations back in 2022.  He was seen in the ER 03/02/2023 with left-sided numbness.  Evaluation was unrevealing.  MRI, and CTA showed no abnormalities.  He subsequently had an event monitor that showed no evidence of A-fib and a 2D echo that raised the possibility of an intra atrial shunt with abnormal bubble study.  Transcranial Doppler was negative.  I believe he would benefit from having a TEE to further evaluate his interatrial septum, his mitral valve and potential for shunting.  I suspect this is going to be negative.  If it is negative, no further workup is necessary for our point of view and he will be cleared for his upcoming EGD.

## 2023-06-28 NOTE — Patient Instructions (Addendum)
 Medication Instructions:  NO CHANGES  *If you need a refill on your cardiac medications before your next appointment, please call your pharmacy*  Lab Work: BMET and CBC today -- pre-procedure labs  If you have labs (blood work) drawn today and your tests are completely normal, you will receive your results only by: MyChart Message (if you have MyChart) OR A paper copy in the mail If you have any lab test that is abnormal or we need to change your treatment, we will call you to review the results.  Testing/Procedures: Transesophageal Echocardiogram at [facility]  Follow-Up: At [facility], you and your health needs are our priority.  As part of our continuing mission to provide you with exceptional heart care, our providers are all part of one team.  This team includes your primary Cardiologist (physician) and Advanced Practice Providers or APPs (Physician Assistants and Nurse Practitioners) who all work together to provide you with the care you need, when you need it.  Your next appointment:   AS NEEDED with Dr. Allyson Sabal  -- pending TEE results   Will need to postpone EGD until TEE is completed  We recommend signing up for the patient portal called "MyChart".  Sign up information is provided on this After Visit Summary.  MyChart is used to connect with patients for Virtual Visits (Telemedicine).  Patients are able to view lab/test results, encounter notes, upcoming appointments, etc.  Non-urgent messages can be sent to your provider as well.   To learn more about what you can do with MyChart, go to [email or web address].   Other Instructions     Dear George Peterson  You are scheduled for a TEE (Transesophageal Echocardiogram) on Thursday, April 10 with Dr. Cristal Deer.  Please arrive at the [facility] (Main Entrance A) at [facility]: [address] [place], [place] [address] at 10:30 AM (This time is 1 hour(s) before your procedure to ensure your preparation).    Free valet parking service is available. You will check in at ADMITTING.   *Please Note: You will receive a call the day before your procedure to confirm the appointment time. That time may have changed from the original time based on the schedule for that day.*   DIET:  Nothing to eat or drink after midnight except a sip of water with medications (see medication instructions below)  MEDICATION INSTRUCTIONS: !!IF ANY NEW MEDICATIONS ARE STARTED AFTER TODAY, PLEASE NOTIFY YOUR PROVIDER AS SOON AS POSSIBLE!!  FYI: Medications such as Semaglutide (Ozempic, [place]), Tirzepatide (Mounjaro, Zepbound), Dulaglutide (Trulicity), etc ("GLP1 agonists") AND Canagliflozin (Invokana), Dapagliflozin (Farxiga), Empagliflozin (Jardiance), Ertugliflozin (Steglatro), Bexagliflozin [employer]) or any combination with one of these drugs such as Invokamet (Canagliflozin/Metformin), Synjardy (Empagliflozin/Metformin), etc ("SGLT2 inhibitors") must be held around the time of a procedure. This is not a comprehensive list of all of these drugs. Please review all of your medications and talk to your provider if you take any one of these. If you are not sure, ask your provider.    LABS: CBC and BMET today  FYI:  For your safety, and to allow [place] to monitor your vital signs accurately during the surgery/procedure we request: If you have artificial nails, gel coating, SNS etc, please have those removed prior to your surgery/procedure. Not having the nail coverings /polish removed may result in cancellation or delay of your surgery/procedure.  Your support person will be asked to wait in the waiting room during your procedure.  It is OK to have someone drop  you off and come back when you are ready to be discharged.  You cannot drive after the procedure and will need someone to drive you home.  Bring your insurance cards.  *Special Note: Every effort is made to have your procedure done on time. Occasionally there are  emergencies that occur at the hospital that may cause delays. Please be patient if a delay does occur.          1st Floor: - Lobby - Registration  - Pharmacy  - Lab - Cafe  2nd Floor: - PV Lab - Diagnostic Testing (echo, CT, nuclear med)  3rd Floor: - Vacant  4th Floor: - TCTS (cardiothoracic surgery) - AFib Clinic - Structural Heart Clinic - Vascular Surgery  - Vascular Ultrasound  5th Floor: - HeartCare Cardiology (general and EP) - Clinical Pharmacy for coumadin, hypertension, lipid, weight-loss medications, and med management appointments    Valet parking services will be available as well.

## 2023-06-28 NOTE — Telephone Encounter (Signed)
 Inbound call from patient stating he has a cardiology appointment scheduled for tomorrow 4/10 morning. Requesting to keep EGD scheduled for 4/11 if possible. Requesting a call back. Please advise, thank you.

## 2023-06-28 NOTE — H&P (View-Only) (Signed)
 06/28/2023 George Peterson   [birth year]  [id number]  Primary Physician Karie Georges, MD Primary Cardiologist: Runell Gess MD Nicholes Calamity, [place]  HPI:  George Peterson is a 38 y.o. thin-appearing married Caucasian male with no children who works in IT and was referred by his neurologist, Dr. Lucia Gaskins, for cardiovascular valuation of potential TIA with TEE.  He he is fairly active, exercises on a stationary bike and walks on a daily basis several miles.  He has no other cardiovascular risk factors.  He presented on 03/02/2023 with left-sided numbness to the ER which resolved in 6 to 7 hours.  Workup was unrevealing.  MRI, CT CTA of the head neck showed no abnormalities.  He did see neurology who did transcranial Doppler study which was negative.  He had a event monitor that showed no evidence of A-fib and a transthoracic echo performed 04/19/2023 which was essentially normal except there was a suggestion of intra pulmonary shunting.  A TEE was recommended.  He has had no subsequent episodes.  The neurologist also question the possibility of this being an atypical migraine.   Current Meds  Medication Sig   acetaminophen (TYLENOL) 500 MG tablet Take 500 mg by mouth every 6 (six) hours as needed for headache.    aspirin EC 81 MG tablet Take 1 tablet (81 mg total) by mouth daily. Swallow whole.   betamethasone dipropionate 0.05 % cream Apply 1 application topically 2 (two) times daily as needed.    finasteride (PROPECIA) 1 MG tablet Take 1 mg by mouth daily.   fluticasone (FLONASE) 50 MCG/ACT nasal spray Place 1 spray into both nostrils daily as needed for allergies or rhinitis.   ibuprofen (ADVIL) 600 MG tablet Take 600 mg by mouth 4 (four) times daily.   Multiple Vitamin (MULTIVITAMIN WITH MINERALS) TABS tablet Take 1 tablet by mouth daily.   omeprazole (PRILOSEC) 20 MG capsule TAKE 1 CAPSULE BY MOUTH EVERY DAY   SKYRIZI PEN 150 MG/ML pen Inject 150 mg into the skin See admin  instructions. Every 12 weeks     No Known Allergies  Social History   Socioeconomic History   Marital status: Married    Spouse name: Not on file   Number of children: 0   Years of education: Not on file   Highest education level: Bachelor's degree (e.g., BA, AB, BS)  Occupational History   Occupation: IT  Tobacco Use   Smoking status: Never   Smokeless tobacco: Never  Vaping Use   Vaping status: Never Used  Substance and Sexual Activity   Alcohol use: Yes    Comment: occ, 2-4 drinks a few times per month   Drug use: No   Sexual activity: Yes  Other Topics Concern   Not on file  Social History Narrative   Work or School: full time Museum/gallery curator      Home Situation: living with wife      Spiritual Beliefs: none      Lifestyle: goes to the gym 6 x per week and running, healthy diet            Social Drivers of Health   Financial Resource Strain: Medium Risk (04/25/2021)   Overall Financial Resource Strain (CARDIA)    Difficulty of Paying Living Expenses: Somewhat hard  Food Insecurity: No Food Insecurity (04/25/2021)   Hunger Vital Sign    Worried About Running Out of Food in the Last Year: Never true    Ran Out of  Food in the Last Year: Never true  Transportation Needs: No Transportation Needs (04/25/2021)   PRAPARE - Administrator, Civil Service (Medical): No    Lack of Transportation (Non-Medical): No  Physical Activity: Sufficiently Active (04/25/2021)   Exercise Vital Sign    Days of Exercise per Week: 5 days    Minutes of Exercise per Session: 30 min  Stress: Stress Concern Present (04/25/2021)   [employer] of Occupational Health - Occupational Stress Questionnaire    Feeling of Stress : To some extent  Social Connections: Moderately Isolated (04/25/2021)   Social Connection and Isolation Panel [NHANES]    Frequency of Communication with Friends and Family: Once a week    Frequency of Social Gatherings with Friends and Family: Once a week     Attends Religious Services: Never    Database administrator or Organizations: Yes    Attends Engineer, structural: More than 4 times per year    Marital Status: Married  Catering manager Violence: Not on file     Review of Systems: General: negative for chills, fever, night sweats or weight changes.  Cardiovascular: negative for chest pain, dyspnea on exertion, edema, orthopnea, palpitations, paroxysmal nocturnal dyspnea or shortness of breath Dermatological: negative for rash Respiratory: negative for cough or wheezing Urologic: negative for hematuria Abdominal: negative for nausea, vomiting, diarrhea, bright red blood per rectum, melena, or hematemesis Neurologic: negative for visual changes, syncope, or dizziness All other systems reviewed and are otherwise negative except as noted above.    Blood pressure 138/88, pulse 68, height 5\' 8"  (1.727 m), weight 167 lb (75.8 kg), SpO2 99%.  General appearance: alert and no distress Neck: no adenopathy, no carotid bruit, no JVD, supple, symmetrical, trachea midline, and thyroid not enlarged, symmetric, no tenderness/mass/nodules Lungs: clear to auscultation bilaterally Heart: regular rate and rhythm, S1, S2 normal, no murmur, click, rub or gallop Extremities: extremities normal, atraumatic, no cyanosis or edema Pulses: 2+ and symmetric Skin: Skin color, texture, turgor normal. No rashes or lesions Neurologic: Grossly normal  EKG EKG Interpretation Date/Time:  Wednesday June 28 2023 09:17:52 EDT Ventricular Rate:  68 PR Interval:  138 QRS Duration:  90 QT Interval:  366 QTC Calculation: 389 R Axis:   26  Text Interpretation: Normal sinus rhythm with sinus arrhythmia Normal ECG When compared with ECG of 14-Sep-2020 23:15, PREVIOUS ECG IS PRESENT Confirmed by Nanetta Batty [phone]) on 06/28/2023 9:37:01 AM    ASSESSMENT AND PLAN:   TIA (transient ischemic attack) [name] was referred by his neurologist, Dr. Lucia Gaskins, for  cardiac evaluation because of potential "TIA".  He has no cardiac risk factors.  He was evaluated for palpitations back in 2022.  He was seen in the ER 03/02/2023 with left-sided numbness.  Evaluation was unrevealing.  MRI, and CTA showed no abnormalities.  He subsequently had an event monitor that showed no evidence of A-fib and a 2D echo that raised the possibility of an intra atrial shunt with abnormal bubble study.  Transcranial Doppler was negative.  I believe he would benefit from having a TEE to further evaluate his interatrial septum, his mitral valve and potential for shunting.  I suspect this is going to be negative.  If it is negative, no further workup is necessary for our point of view and he will be cleared for his upcoming EGD.     Runell Gess MD FACP,FACC,FAHA, [facility] 06/28/2023 9:51 AM

## 2023-06-29 ENCOUNTER — Encounter (HOSPITAL_COMMUNITY)
Admission: RE | Disposition: A | Discharge status: Home / Self Care | Payer: Self-pay | Source: Home / Self Care | Attending: Cardiology

## 2023-06-29 ENCOUNTER — Encounter (HOSPITAL_COMMUNITY): Payer: Self-pay | Admitting: Cardiology

## 2023-06-29 ENCOUNTER — Other Ambulatory Visit: Payer: Self-pay

## 2023-06-29 ENCOUNTER — Telehealth: Payer: Self-pay | Admitting: Cardiovascular Disease

## 2023-06-29 ENCOUNTER — Ambulatory Visit (HOSPITAL_COMMUNITY): Admitting: Anesthesiology

## 2023-06-29 ENCOUNTER — Ambulatory Visit (HOSPITAL_BASED_OUTPATIENT_CLINIC_OR_DEPARTMENT_OTHER): Admitting: Anesthesiology

## 2023-06-29 ENCOUNTER — Encounter: Payer: Self-pay | Admitting: *Deleted

## 2023-06-29 ENCOUNTER — Ambulatory Visit (HOSPITAL_COMMUNITY)

## 2023-06-29 ENCOUNTER — Ambulatory Visit (HOSPITAL_COMMUNITY)
Admission: RE | Admit: 2023-06-29 | Discharge: 2023-06-29 | Disposition: A | Discharge status: Home / Self Care | Attending: Cardiology | Admitting: Cardiology

## 2023-06-29 DIAGNOSIS — G459 Transient cerebral ischemic attack, unspecified: Secondary | ICD-10-CM

## 2023-06-29 DIAGNOSIS — Q211 Atrial septal defect, unspecified: Secondary | ICD-10-CM

## 2023-06-29 DIAGNOSIS — I340 Nonrheumatic mitral (valve) insufficiency: Secondary | ICD-10-CM

## 2023-06-29 HISTORY — PX: TRANSESOPHAGEAL ECHOCARDIOGRAM (CATH LAB): EP1270

## 2023-06-29 LAB — CBC
Hematocrit: 40.9 % (ref 37.5–51.0)
Hemoglobin: 14.3 g/dL (ref 13.0–17.7)
MCH: 30.2 pg (ref 26.6–33.0)
MCHC: 35 g/dL (ref 31.5–35.7)
MCV: 87 fL (ref 79–97)
Platelets: 178 10*3/uL (ref 150–450)
RBC: 4.73 x10E6/uL (ref 4.14–5.80)
RDW: 11.9 % (ref 11.6–15.4)
WBC: 6 10*3/uL (ref 3.4–10.8)

## 2023-06-29 LAB — BASIC METABOLIC PANEL WITH GFR
BUN/Creatinine Ratio: 10 (ref 9–20)
BUN: 8 mg/dL (ref 6–20)
CO2: 24 mmol/L (ref 20–29)
Calcium: 9.6 mg/dL (ref 8.7–10.2)
Chloride: 104 mmol/L (ref 96–106)
Creatinine, Ser: 0.81 mg/dL (ref 0.76–1.27)
Glucose: 81 mg/dL (ref 70–99)
Potassium: 4.4 mmol/L (ref 3.5–5.2)
Sodium: 141 mmol/L (ref 134–144)
eGFR: 116 mL/min/{1.73_m2} (ref >59)

## 2023-06-29 LAB — ECHO TEE

## 2023-06-29 SURGERY — TRANSESOPHAGEAL ECHOCARDIOGRAM (TEE) (CATHLAB)
Anesthesia: Monitor Anesthesia Care

## 2023-06-29 MED ORDER — PROPOFOL 10 MG/ML IV BOLUS
INTRAVENOUS | Status: DC | PRN
  Administered 2023-06-29: 50 mg via INTRAVENOUS
  Administered 2023-06-29: 30 mg via INTRAVENOUS

## 2023-06-29 MED ORDER — GLYCOPYRROLATE PF 0.2 MG/ML IJ SOSY
PREFILLED_SYRINGE | INTRAMUSCULAR | Status: DC | PRN
  Administered 2023-06-29: .2 mg via INTRAVENOUS

## 2023-06-29 MED ORDER — PROPOFOL 500 MG/50ML IV EMUL
INTRAVENOUS | Status: DC | PRN
  Administered 2023-06-29: 150 ug/kg/min via INTRAVENOUS

## 2023-06-29 MED ORDER — SODIUM CHLORIDE 0.9 % IV SOLN: INTRAVENOUS | Status: DC

## 2023-06-29 MED ORDER — BUTAMBEN-TETRACAINE-BENZOCAINE 2-2-14 % EX AERO
INHALATION_SPRAY | CUTANEOUS | Status: DC | PRN
  Administered 2023-06-29: 1 via TOPICAL

## 2023-06-29 MED ORDER — LIDOCAINE 2% (20 MG/ML) 5 ML SYRINGE
INTRAMUSCULAR | Status: DC | PRN
  Administered 2023-06-29: 100 mg via INTRAVENOUS

## 2023-06-29 NOTE — Progress Notes (Signed)
  Echocardiogram Echocardiogram Transesophageal has been performed.  Janalyn Harder 06/29/2023, 11:41 AM

## 2023-06-29 NOTE — Telephone Encounter (Signed)
 Dalia Heading M routed conversation to Cv Div Preop CallbackJust now (2:40 PM)   Dalia Heading MJust now (2:40 PM)   SW Follow Up:           Patient is calling to check on the status of his clearance.  His procedure is scheduled for tomorrow.      Note   Gumaro, [place] [phone]  [name]

## 2023-06-29 NOTE — Telephone Encounter (Signed)
 Patient scheduled for an EGD with you tomorrow at 7:30. Still haven't received cardiac clearance. I spoke with the patient because he had an appt with cardiology today for an echo. I told patient it is likely that we wouldn't receive a clearance today because of a delay on getting ECHO results. Pt stated that cardiology told him he should be fine to proceed in the morning but I wanted to run it by you first. Would you prefer to reschedule?

## 2023-06-29 NOTE — Telephone Encounter (Signed)
 I s/w the pt and he has been informed he has been cleared per Dr. Allyson Sabal. I also reviewed the notes per preop APP from Dr. Allyson Sabal if another neuro episode , then would need to proceed with loop recorder. Pt aware clearance has been sent to the GI office. Pt was very grateful for all the hard work our office did top help him.     Telephone 06/20/2023 [facility]    Annabell Howells, [place] Clearance [id number]  Reason for conversation   All Conversations: Clearance [id number] (Newest Message First)Dick, Devoria Albe., NP     06/29/23  3:46 PM Note     Patient Name: George Peterson  DOB: September 28, 1985 MRN: [id number]   Primary Cardiologist: None   Chart reviewed as part of pre-operative protocol coverage. Given past medical history and time since last visit, based on ACC/AHA guidelines, [name] is at acceptable risk for the planned procedure without further cardiovascular testing.    Per Dr. Allyson Sabal no consequential shunt on TEE and therefore no cardiac cause of neuro event. If he has another episode will need a loop recorder.    Patient is cleared to proceed with scheduled endoscopy procedures.   The patient was advised that if he develops new symptoms prior to surgery to contact our office to arrange for a follow-up visit, and he verbalized understanding.   I will route this recommendation to the requesting party via Epic fax function and remove from pre-op pool.   Please call with questions.   Napoleon Form, Leodis Rains, NP 06/29/2023, 3:46 PM        06/29/23  2:52 PM You routed this conversation to Cv Div Preop   Me     06/29/23  2:52 PM Note Dalia Heading M routed conversation to Cv Div Preop CallbackJust now (2:40 PM)    Dalia Heading MJust now (2:40 PM)    SW Follow Up:           Patient is calling to check on the status of his clearance.  His procedure is scheduled for tomorrow.       Note    [name], [name] [phone]  [name]

## 2023-06-29 NOTE — CV Procedure (Signed)
    TRANSESOPHAGEAL ECHOCARDIOGRAM   NAME:  George Peterson   MRN: [id number] DOB:  1985/11/05   ADMIT DATE: 06/29/2023  INDICATIONS: TIA  PROCEDURE:   Informed consent was obtained prior to the procedure. The risks, benefits and alternatives for the procedure were discussed and the patient comprehended these risks.  Risks include, but are not limited to, cough, sore throat, vomiting, nausea, somnolence, esophageal and stomach trauma or perforation, bleeding, low blood pressure, aspiration, pneumonia, infection, trauma to the teeth and death.    Procedural time out performed. The oropharynx was anesthetized with topical cetacaine.    Patient received monitored anesthesia care under the supervision of Dr. Tacy Dura. Patient received a total of 474.16 mg propofol during the procedure.  The transesophageal probe was inserted in the esophagus and stomach without difficulty and multiple views were obtained.    COMPLICATIONS:    There were no immediate complications.  FINDINGS:  LEFT VENTRICLE: EF = 60-65%. No regional wall motion abnormalities.  RIGHT VENTRICLE: Normal size and function.   LEFT ATRIUM: No thrombus/mass.  LEFT ATRIAL APPENDAGE: No thrombus/mass.   RIGHT ATRIUM: No thrombus/mass.  AORTIC VALVE:  Trileaflet. No regurgitation. No vegetation.  MITRAL VALVE:    Normal structure. Trivial regurgitation. No vegetation.  TRICUSPID VALVE: Normal structure. Trivial regurgitation. No vegetation.  PULMONIC VALVE: Grossly normal structure. Trivial regurgitation. No apparent vegetation.  INTERATRIAL SEPTUM: Very small area of color flow across the intra-atrial septum, adjacent to but not in usual location of PFO. Agitated saline negative for right to left intracardiac shunt, but late positive single bubble seen with cough consistent with potential very small extracardiac shunt  PERICARDIUM: No effusion noted.  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 3D images of this taken that show trivial color flow across the septum. No cardiac right to left shunt seen with agitated saline, one late bubble with cough which may represent extracardiac shunt.   Jodelle Red, MD, PhD, [facility]  [place]  Heart & Vascular at [facility] at [facility] [address], [address] [place], [place] [address] [phone]   11:32 AM

## 2023-06-29 NOTE — Anesthesia Preprocedure Evaluation (Addendum)
 Anesthesia Evaluation  Patient identified by MRN, date of birth, ID band Patient awake    Reviewed: Allergy & Precautions, H&P , NPO status , Patient's Chart, lab work & pertinent test results  Airway Mallampati: II  TM Distance: >3 FB Neck ROM: Full    Dental no notable dental hx.    Pulmonary neg pulmonary ROS   Pulmonary exam normal breath sounds clear to auscultation       Cardiovascular Exercise Tolerance: Good negative cardio ROS Normal cardiovascular exam Rhythm:Regular Rate:Normal  ECHO    1. Left ventricular ejection fraction, by estimation, is 60 to 65%. The  left ventricle has normal function. The left ventricle has no regional  wall motion abnormalities. Left ventricular diastolic parameters were  normal.   2. Right ventricular systolic function is normal. The right ventricular  size is normal.   3. Cannot rule out small density off the anterior mitral valve leaflet  vs. redudant chordae tendinae. Trivial mitral valve regurgitation. No  evidence of mitral stenosis.   4. The aortic valve is tricuspid. Aortic valve regurgitation is not  visualized. Aortic valve sclerosis is present, with no evidence of aortic  valve stenosis.   5. The inferior vena cava is normal in size with greater than 50%  respiratory variability, suggesting right atrial pressure of 3 mmHg.   6. Agitated saline contrast bubble study was positive with shunting  observed after >6 cardiac cycles suggestive of intrapulmonary shunting.      Neuro/Psych TIAnegative neurological ROS  negative psych ROS   GI/Hepatic negative GI ROS, Neg liver ROS,,,  Endo/Other  negative endocrine ROS    Renal/GU Renal diseasenegative Renal ROS  negative genitourinary   Musculoskeletal negative musculoskeletal ROS (+)    Abdominal   Peds negative pediatric ROS (+)  Hematology negative hematology ROS (+)   Anesthesia Other Findings    Reproductive/Obstetrics negative OB ROS                             Anesthesia Physical Anesthesia Plan  ASA: 2  Anesthesia Plan: MAC   Post-op Pain Management: Minimal or no pain anticipated   Induction: Intravenous  PONV Risk Score and Plan: 1 and Propofol infusion  Airway Management Planned: Natural Airway and Simple Face Mask  Additional Equipment: None  Intra-op Plan:   Post-operative Plan: Extubation in OR  Informed Consent: I have reviewed the patients History and Physical, chart, labs and discussed the procedure including the risks, benefits and alternatives for the proposed anesthesia with the patient or authorized representative who has indicated his/her understanding and acceptance.       Plan Discussed with: Anesthesiologist and CRNA  Anesthesia Plan Comments:         Anesthesia Quick Evaluation

## 2023-06-29 NOTE — Telephone Encounter (Addendum)
   Patient Name: George Peterson  DOB: September 20, 1985 MRN: [id number]  Primary Cardiologist: None  Chart reviewed as part of pre-operative protocol coverage. Given past medical history and time since last visit, based on ACC/AHA guidelines, Kweku Stankey is at acceptable risk for the planned procedure without further cardiovascular testing.   Per Dr. Allyson Sabal no consequential shunt on TEE and therefore no cardiac cause of neuro event. If he has another episode will need a loop recorder.   Patient is cleared to proceed with scheduled endoscopy procedures.  The patient was advised that if he develops new symptoms prior to surgery to contact our office to arrange for a follow-up visit, and he verbalized understanding.  I will route this recommendation to the requesting party via Epic fax function and remove from pre-op pool.  Please call with questions.  Napoleon Form, Leodis Rains, NP 06/29/2023, 3:46 PM

## 2023-06-29 NOTE — Telephone Encounter (Signed)
 Follow Up:      Patient is calling to check on the status of his clearance.  His procedure is scheduled for tomorrow.

## 2023-06-29 NOTE — Discharge Instructions (Signed)

## 2023-06-29 NOTE — Anesthesia Postprocedure Evaluation (Signed)
 Anesthesia Post Note  Patient: Carolos Fecher  Procedure(s) Performed: TRANSESOPHAGEAL ECHOCARDIOGRAM     Patient location during evaluation: PACU Anesthesia Type: MAC Level of consciousness: awake and alert Pain management: pain level controlled Vital Signs Assessment: post-procedure vital signs reviewed and stable Respiratory status: spontaneous breathing, nonlabored ventilation, respiratory function stable and patient connected to nasal cannula oxygen Cardiovascular status: stable and blood pressure returned to baseline Postop Assessment: no apparent nausea or vomiting Anesthetic complications: no   No notable events documented.  Last Vitals:  Vitals:   06/29/23 1144 06/29/23 1154  BP: 135/89 127/82  Pulse: 93 82  Resp: 17 14  Temp:    SpO2: 96% 96%    Last Pain:  Vitals:   06/29/23 1154  TempSrc:   PainSc: 0-No pain                 Amrie Gurganus

## 2023-06-29 NOTE — Telephone Encounter (Signed)
I will forward to pre op APP for review.

## 2023-06-29 NOTE — Transfer of Care (Signed)
 Immediate Anesthesia Transfer of Care Note  Patient: George Peterson  Procedure(s) Performed: TRANSESOPHAGEAL ECHOCARDIOGRAM  Patient Location: PACU  Anesthesia Type:MAC  Level of Consciousness: drowsy  Airway & Oxygen Therapy: Patient Spontanous Breathing and Patient connected to face mask oxygen  Post-op Assessment: Report given to RN and Post -op Vital signs reviewed and stable  Post vital signs: Reviewed and stable  Last Vitals:  Vitals Value Taken Time  BP 104/90 06/29/23 1134  Temp 36.7 C 06/29/23 1134  Pulse 84 06/29/23 1135  Resp 20 06/29/23 1135  SpO2 92 % 06/29/23 1135  Vitals shown include unfiled device data.  Last Pain:  Vitals:   06/29/23 1134  TempSrc: Tympanic  PainSc: Asleep         Complications: No notable events documented.

## 2023-06-29 NOTE — Telephone Encounter (Signed)
 Inbound call from patient, would like to speak with a nurse to confirm that cardiology clearance was received for EGD tomorrow.

## 2023-06-29 NOTE — Interval H&P Note (Signed)
 History and Physical Interval Note:  06/29/2023 10:31 AM  George Peterson  has presented today for surgery, with the diagnosis of TIA.  The various methods of treatment have been discussed with the patient and family. After consideration of risks, benefits and other options for treatment, the patient has consented to  Procedure(s): TRANSESOPHAGEAL ECHOCARDIOGRAM (N/A) as a surgical intervention.  The patient's history has been reviewed, patient examined, no change in status, stable for surgery.  I have reviewed the patient's chart and labs.  Questions were answered to the patient's satisfaction.     Jeanclaude Wentworth Cristal Deer

## 2023-06-30 ENCOUNTER — Ambulatory Visit (AMBULATORY_SURGERY_CENTER): Admitting: Gastroenterology

## 2023-06-30 ENCOUNTER — Encounter: Payer: Self-pay | Admitting: Gastroenterology

## 2023-06-30 VITALS — BP 115/72 | HR 79 | Temp 97.9°F | Resp 15 | Ht 67.5 in | Wt 163.0 lb

## 2023-06-30 DIAGNOSIS — Q399 Congenital malformation of esophagus, unspecified: Secondary | ICD-10-CM | POA: Diagnosis not present

## 2023-06-30 DIAGNOSIS — K200 Eosinophilic esophagitis: Secondary | ICD-10-CM

## 2023-06-30 DIAGNOSIS — K259 Gastric ulcer, unspecified as acute or chronic, without hemorrhage or perforation: Secondary | ICD-10-CM | POA: Diagnosis not present

## 2023-06-30 DIAGNOSIS — K2980 Duodenitis without bleeding: Secondary | ICD-10-CM

## 2023-06-30 DIAGNOSIS — R1310 Dysphagia, unspecified: Secondary | ICD-10-CM

## 2023-06-30 DIAGNOSIS — K219 Gastro-esophageal reflux disease without esophagitis: Secondary | ICD-10-CM

## 2023-06-30 DIAGNOSIS — K2950 Unspecified chronic gastritis without bleeding: Secondary | ICD-10-CM

## 2023-06-30 DIAGNOSIS — K3189 Other diseases of stomach and duodenum: Secondary | ICD-10-CM | POA: Diagnosis not present

## 2023-06-30 MED ORDER — SODIUM CHLORIDE 0.9 % IV SOLN
500.0000 mL | INTRAVENOUS | Status: DC
Start: 2023-06-30 — End: 2023-06-30

## 2023-06-30 NOTE — Progress Notes (Signed)
 Called to room to assist during endoscopic procedure.  Patient ID and intended procedure confirmed with present staff. Received instructions for my participation in the procedure from the performing physician.

## 2023-06-30 NOTE — Progress Notes (Signed)
 History and Physical Interval Note:  06/30/2023 8:26 AM  George Peterson  has presented today for endoscopic procedure(s), with the diagnosis of  Encounter Diagnoses  Name Primary?   Gastroesophageal reflux disease, unspecified whether esophagitis present Yes   Dysphagia, unspecified type   .  The various methods of evaluation and treatment have been discussed with the patient and/or family. After consideration of risks, benefits and other options for treatment, the patient has consented to  the endoscopic procedure(s).   The patient's history has been reviewed, patient examined, no change in status, stable for endoscopic procedure(s).  I have reviewed the patient's chart and labs.  Questions were answered to the patient's satisfaction.     Aleyah Balik E. Tomasa Rand, MD [facility]

## 2023-06-30 NOTE — Progress Notes (Signed)
 Pt's states no medical or surgical changes since previsit or office visit.

## 2023-06-30 NOTE — Progress Notes (Signed)
 Sedate, gd SR, tolerated procedure well, VSS, report to RN

## 2023-06-30 NOTE — Patient Instructions (Addendum)
 Continue taking omeprazole 20 mg once daily.  YOU HAD AN ENDOSCOPIC PROCEDURE TODAY AT THE [facility]:   Refer to the procedure report that was given to you for any specific questions about what was found during the examination.  If the procedure report does not answer your questions, please call your gastroenterologist to clarify.  If you requested that your care partner not be given the details of your procedure findings, then the procedure report has been included in a sealed envelope for you to review at your convenience later.  YOU SHOULD EXPECT: Some feelings of bloating in the abdomen. Passage of more gas than usual.  Walking can help get rid of the air that was put into your GI tract during the procedure and reduce the bloating. If you had a lower endoscopy (such as a colonoscopy or flexible sigmoidoscopy) you may notice spotting of blood in your stool or on the toilet paper. If you underwent a bowel prep for your procedure, you may not have a normal bowel movement for a few days.  Please Note:  You might notice some irritation and congestion in your nose or some drainage.  This is from the oxygen used during your procedure.  There is no need for concern and it should clear up in a day or so.  SYMPTOMS TO REPORT IMMEDIATELY:  Following upper endoscopy (EGD)  Vomiting of blood or coffee ground material  New chest pain or pain under the shoulder blades  Painful or persistently difficult swallowing  New shortness of breath  Fever of 100F or higher  Black, tarry-looking stools  For urgent or emergent issues, a gastroenterologist can be reached at any hour by calling [phone]. Do not use MyChart messaging for urgent concerns.    DIET:  We do recommend a small meal at first, but then you may proceed to your regular diet.  Drink plenty of fluids but you should avoid alcoholic beverages for 24 hours.  ACTIVITY:  You should plan to take it easy for the rest of today and  you should NOT DRIVE or use heavy machinery until tomorrow (because of the sedation medicines used during the test).    FOLLOW UP: Our staff will call the number listed on your records the next business day following your procedure.  We will call around 7:15- 8:00 am to check on you and address any questions or concerns that you may have regarding the information given to you following your procedure. If we do not reach you, we will leave a message.     If any biopsies were taken you will be contacted by phone or by letter within the next 1-3 weeks.  Please call us at [phone] if you have not heard about the biopsies in 3 weeks.    SIGNATURES/CONFIDENTIALITY: You and/or your care partner have signed paperwork which will be entered into your electronic medical record.  These signatures attest to the fact that that the information above on your After Visit Summary has been reviewed and is understood.  Full responsibility of the confidentiality of this discharge information lies with you and/or your care-partner.

## 2023-06-30 NOTE — Op Note (Signed)
  [facility]oscopy Center Patient Name: George Peterson Procedure Date: 06/30/2023 8:15 AM MRN: [id number] Endoscopist: Lorin Picket E. Tomasa Rand , MD, [id number] Age: 38 Referring MD:  Date of Birth: 17-Oct-1985 Gender: Male Account #: [id number] Procedure:                Upper GI endoscopy Indications:              Dysphagia, Abdominal bloating Medicines:                Monitored Anesthesia Care Procedure:                Pre-Anesthesia Assessment:                           - Prior to the procedure, a History and Physical                            was performed, and patient medications and                            allergies were reviewed. The patient's tolerance of                            previous anesthesia was also reviewed. The risks                            and benefits of the procedure and the sedation                            options and risks were discussed with the patient.                            All questions were answered, and informed consent                            was obtained. Prior Anticoagulants: The patient has                            taken no anticoagulant or antiplatelet agents. ASA                            Grade Assessment: II - A patient with mild systemic                            disease. After reviewing the risks and benefits,                            the patient was deemed in satisfactory condition to                            undergo the procedure.                           After obtaining informed consent, the endoscope was  passed under direct vision. Throughout the                            procedure, the patient's blood pressure, pulse, and                            oxygen saturations were monitored continuously. The                            Olympus scope [id number] was introduced through the                            mouth, and advanced to the second part of duodenum.                            The upper GI  endoscopy was accomplished without                            difficulty. The patient tolerated the procedure                            well. Scope In: Scope Out: Findings:                 The distal esophagus was mildly tortuous.                           Normal mucosa was found in the entire esophagus.                            Biopsies were obtained from the proximal and distal                            esophagus with cold forceps for histology of                            suspected eosinophilic esophagitis. Estimated blood                            loss was minimal.                           The entire examined stomach was normal. Biopsies                            were taken with a cold forceps for Helicobacter                            pylori testing. Estimated blood loss was minimal.                           A few erosions without bleeding were found in the                            duodenal bulb. Biopsies were taken  with a cold                            forceps for histology. Estimated blood loss was                            minimal.                           The exam of the duodenum was otherwise normal. Complications:            No immediate complications. Estimated Blood Loss:     Estimated blood loss was minimal. Impression:               - Tortuous esophagus.                           - Normal mucosa was found in the entire esophagus.                           - Normal stomach. Biopsied.                           - Duodenal erosions without bleeding. Biopsied.                           - Biopsies were taken with a cold forceps for                            evaluation of eosinophilic esophagitis.                           - No endoscopic abnormalities to explain dysphagia. Recommendation:           - Patient has a contact number available for                            emergencies. The signs and symptoms of potential                            delayed  complications were discussed with the                            patient. Return to normal activities tomorrow.                            Written discharge instructions were provided to the                            patient.                           - Resume previous diet.                           - Continue present medications.                           -  Await pathology results.                           - Recommend trial of pantoprazole 40 mg PO daily                            for 4 weeks. [name] E. Tomasa Rand, MD 06/30/2023 8:55:48 AM This report has been signed electronically.

## 2023-07-04 ENCOUNTER — Telehealth: Payer: Self-pay

## 2023-07-04 LAB — SURGICAL PATHOLOGY

## 2023-07-04 NOTE — Telephone Encounter (Signed)
 Follow up call to pt, lm for pt to call if having any difficulty with normal activities or eating and drinking.  Also to call if any other questions or concerns.

## 2023-07-10 ENCOUNTER — Encounter: Payer: Self-pay | Admitting: Gastroenterology

## 2023-07-10 NOTE — Progress Notes (Signed)
 Mr. Moan,  The biopsies taken from your stomach were notable for mild chronic gastritis (inflammation) which is a common finding, but there was no evidence of Helicobacter pylori infection. This common finding is not likely to explain your symptoms and there is no specific treatment or further evaluation recommended for this. The biopsies of your duodenum showed benign inflammatory changes called peptic duodenitis, which is irritation of the duodenum typically from stomach acid.  The biopsies of your esophagus showed elevated levels of eosinophils in the distal esophagus, but essentially normal esophageal mucosa in the proximal esophagus. Elevated eosinophils can be related to acid reflux, but can also be from a condition called eosinophilic esophagitis, which is more of an allergic condition of the esophagus.  Typically, when the eosinophils are localized to the distal (bottom) of the esophagus, it is more likely that they are from acid reflux.  I recommend you take the pantoprazole once daily for a month as previously recommended, and then follow-up with us  in the office in the next 2-3 months to see how your symptoms are doing.  We may discuss repeating an upper endoscopy at some point to reassess for eosinophilic esophagitis.  Maya,  Can you please book an office visit with myself or an app in 2-3 months?

## 2023-07-10 NOTE — Progress Notes (Signed)
 Patient has been scheduled for follow up on 08/31/23 at 9:10.

## 2023-07-31 ENCOUNTER — Ambulatory Visit: Admitting: Cardiovascular Disease

## 2023-08-31 ENCOUNTER — Encounter: Payer: Self-pay | Admitting: Gastroenterology

## 2023-08-31 ENCOUNTER — Ambulatory Visit: Admitting: Gastroenterology

## 2023-08-31 VITALS — BP 118/80 | HR 86 | Ht 67.5 in | Wt 164.0 lb

## 2023-08-31 DIAGNOSIS — K594 Anal spasm: Secondary | ICD-10-CM | POA: Diagnosis not present

## 2023-08-31 DIAGNOSIS — K828 Other specified diseases of gallbladder: Secondary | ICD-10-CM

## 2023-08-31 DIAGNOSIS — R1310 Dysphagia, unspecified: Secondary | ICD-10-CM

## 2023-08-31 DIAGNOSIS — K209 Esophagitis, unspecified without bleeding: Secondary | ICD-10-CM

## 2023-08-31 MED ORDER — OMEPRAZOLE 20 MG PO CPDR: 20.0000 mg | DELAYED_RELEASE_CAPSULE | Freq: Two times a day (BID) | ORAL | 3 refills | Status: AC

## 2023-08-31 NOTE — Patient Instructions (Signed)
 We have sent the following medications to your pharmacy for you to pick up at your convenience: Omeprazole  20 mg twice daily.  You have been scheduled for an endoscopy. Please follow written instructions given to you at your visit today.  If you use inhalers (even only as needed), please bring them with you on the day of your procedure.  If you take any of the following medications, they will need to be adjusted prior to your procedure:   DO NOT TAKE 7 DAYS PRIOR TO TEST- Trulicity (dulaglutide) Ozempic, Wegovy (semaglutide) Mounjaro (tirzepatide) Bydureon Bcise (exanatide extended release)  DO NOT TAKE 1 DAY PRIOR TO YOUR TEST Rybelsus (semaglutide) Adlyxin (lixisenatide) Victoza (liraglutide) Byetta (exanatide) ___________________________________________________________________________   _______________________________________________________  If your blood pressure at your visit was 140/90 or greater, please contact your primary care physician to follow up on this.  _______________________________________________________  If you are age 15 or older, your body mass index should be between 23-30. Your Body mass index is 25.31 kg/m. If this is out of the aforementioned range listed, please consider follow up with your Primary Care Provider.  If you are age 83 or younger, your body mass index should be between 19-25. Your Body mass index is 25.31 kg/m. If this is out of the aformentioned range listed, please consider follow up with your Primary Care Provider.   ________________________________________________________  The [facility] providers would like to encourage you to use MYCHART to communicate with providers for non-urgent requests or questions.  Due to long hold times on the telephone, sending your provider a message by [facility] may be a faster and more efficient way to get a response.  Please allow 48 business hours for a response.  Please remember that this is for  non-urgent requests.  _______________________________________________________   It was a pleasure to see you today!  Thank you for trusting me with your gastrointestinal care!

## 2023-08-31 NOTE — Progress Notes (Signed)
 Discussed the use of AI scribe software for clinical note transcription with the patient, who gave verbal consent to proceed.  HPI : George Peterson is a 38 year old male who presents for follow-up of dysphagia and related upper gastrointestinal symptoms.  He was initially seen by [facility] office March 31 with symptoms of solid dysphagia and episodic nausea and bloating, without significant heartburn or acid reflux.  He underwent an upper endoscopy April 11 which showed essentially normal-appearing esophagus and stomach with duodenal erosions.  Biopsies showed elevated eosinophils in the distal esophagus, but biopsies.  Gastric and duodenal biopsies showed inflammation without H. pylori.  He started taking omeprazole  shortly before his upper endoscopy.  Over the past two months, he has experienced some improvement in his upper gastrointestinal symptoms, though not significantly. His primary concern remains dysphagia, with food feeling 'stuck' in his throat. There is a reduction in the frequency of these episodes, and he can manage meals with less water intake, indicating some improvement. He avoids steak, chicken, and bread due to dietary preferences rather than swallowing difficulties. He can consume spicy foods like curries without aggravating his symptoms.  He experiences less frequent nausea and bloating compared to before, though these symptoms have not completely resolved. He reports minimal occurrences of heartburn or acid taste in his mouth, with only one episode in the past couple of months.   He is currently taking omeprazole  once daily, usually before breakfast  Bowel movements are normal.  He mentions experiencing occasional anal spasms, which occur a couple of times a year and last for a minute or two. These spasms have not worsened and are not frequent enough to require medication.         EGD June 30, 2023  - Tortuous esophagus.  - Normal mucosa was found in the  entire esophagus.  - Normal stomach. Biopsied.  - Duodenal erosions without bleeding. Biopsied.  - Biopsies were taken with a cold forceps for evaluation of eosinophilic esophagitis.  - No endoscopic abnormalities to explain dysphagia.  FINAL DIAGNOSIS       1. Surgical [P], duodenal bulb :      REACTIVE DUODENAL MUCOSA WITH FOCAL GASTRIC METAPLASIA COMPATIBLE PEPTIC      DUODENITIS       2. Surgical [P], gastric :      MINIMAL CHRONIC GASTRITIS WITH REACTIVE EPITHELIAL CHANGES      NEGATIVE FOR H. PYLORI, INTESTINAL METAPLASIA, DYSPLASIA AND CARCINOMA       3. Surgical [P], distal esophagus :      EOSINOPHIL RICH ESOPHAGITIS (SEE MICROSCOPIC COMMENT)      MILD CHRONIC GASTRITIS      NEGATIVE FOR INTESTINAL METAPLASIA, DYSPLASIA AND CARCINOMA       4. Surgical [P], proximal esophagus :      REACTIVE SQUAMOUS MUCOSA      NEGATIVE FOR GLANDULAR EPITHELIUM, EOSINOPHILS, DYSPLASIA AND CARCINOMA   . 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 the differential diagnosis of which would include  severe reflux esophagitis and true eosinophilic esophagitis.Clinical and  endoscopic correlation is recommended   Past Medical History:  Diagnosis Date   Allergy    Kidney stones    multiple stones passed as child, had lithotripsy once  Psoriasis      Past Surgical History:  Procedure Laterality Date   EXTRACORPOREAL SHOCK WAVE LITHOTRIPSY Left 12/12/2019   Procedure: LEFT EXTRACORPOREAL SHOCK WAVE LITHOTRIPSY (ESWL);  Surgeon: Sherlyn Ditto, MD;  Location: [facility];  Service: Urology;  Laterality: Left;   LITHOTRIPSY  2007   lymph node biospy     with splenic  infection   PILONIDAL CYST EXCISION  2008   TRANSESOPHAGEAL ECHOCARDIOGRAM (CATH LAB) N/A 06/29/2023   Procedure: TRANSESOPHAGEAL ECHOCARDIOGRAM;  Surgeon: Sheryle Donning, MD;  Location: [facility] INVASIVE CV LAB;  Service: Cardiovascular;  Laterality: N/A;   WISDOM TOOTH EXTRACTION     Family History  Problem Relation Age of Onset   Hypertension Mother    Hyperlipidemia Mother    Colon polyps Mother    Skin cancer Mother    Arthritis Father    Kidney Stones Father    ALS Father    AAA (abdominal aortic aneurysm) Maternal Grandfather    Heart disease Paternal Grandfather    Social History   Tobacco Use   Smoking status: Never   Smokeless tobacco: Never  Vaping Use   Vaping status: Never Used  Substance Use Topics   Alcohol use: Yes    Comment: occ, 2-4 drinks a few times per month   Drug use: No   Current Outpatient Medications  Medication Sig Dispense Refill   acetaminophen  (TYLENOL ) 500 MG tablet Take 500 mg by mouth every 6 (six) hours as needed for headache.      aspirin  EC 81 MG tablet Take 1 tablet (81 mg total) by mouth daily. Swallow whole.     finasteride (PROPECIA) 1 MG tablet Take 1 mg by mouth daily.     fluticasone (FLONASE) 50 MCG/ACT nasal spray Place 1 spray into both nostrils daily as needed for allergies or rhinitis.     ibuprofen (ADVIL) 600 MG tablet Take 600 mg by mouth 4 (four) times daily.     loratadine (CLARITIN) 10 MG tablet Take 10 mg by mouth daily.     Multiple Vitamin (MULTIVITAMIN WITH MINERALS) TABS tablet Take 1 tablet by mouth daily.     omeprazole  (PRILOSEC) 20 MG capsule TAKE 1 CAPSULE BY MOUTH EVERY DAY 90 capsule 1   SKYRIZI PEN 150 MG/ML pen Inject 150 mg into the skin See admin instructions. Every 12 weeks     No current facility-administered medications for this visit.   No Known Allergies   Review of Systems: All systems reviewed and negative except where noted in HPI.    No results found.  Physical Exam: BP 118/80    Pulse 86   Ht 5' 7.5 (1.715 m)   Wt 164 lb (74.4 kg)   BMI 25.31 kg/m  Constitutional: Pleasant,well-developed, Caucasian male in no acute distress. HEENT: Normocephalic and atraumatic. Conjunctivae are normal. No scleral icterus. Neurological: Alert and oriented to person place and time. Skin: Skin is warm and dry. No rashes noted. Psychiatric: Normal mood and affect. Behavior is normal.  CBC    Component Value Date/Time   WBC 6.0 06/28/2023 1010   WBC 6.1 03/02/2023 1830   RBC 4.73 06/28/2023 1010   RBC 4.95 03/02/2023 1830   HGB 14.3 06/28/2023 1010   HCT 40.9 06/28/2023 1010   PLT 178 06/28/2023 1010   MCV 87 06/28/2023 1010   MCH 30.2 06/28/2023 1010   MCH 29.7 03/02/2023 1830   MCHC 35.0 06/28/2023 1010   MCHC 35.3 03/02/2023 1830   RDW 11.9 06/28/2023  1010   LYMPHSABS 2.1 03/02/2023 1830   MONOABS 0.4 03/02/2023 1830   EOSABS 0.1 03/02/2023 1830   BASOSABS 0.0 03/02/2023 1830    CMP     Component Value Date/Time   NA 141 06/28/2023 1010   K 4.4 06/28/2023 1010   CL 104 06/28/2023 1010   CO2 24 06/28/2023 1010   GLUCOSE 81 06/28/2023 1010   GLUCOSE 103 (H) 03/02/2023 1830   BUN 8 06/28/2023 1010   CREATININE 0.81 06/28/2023 1010   CALCIUM 9.6 06/28/2023 1010   PROT 7.5 03/02/2023 1830   ALBUMIN 4.8 03/02/2023 1830   AST 21 03/02/2023 1830   ALT 22 03/02/2023 1830   ALKPHOS 53 03/02/2023 1830   BILITOT 0.8 03/02/2023 1830   GFRNONAA >60 03/02/2023 1830   GFRAA >60 12/12/2019 1914       Latest Ref Rng & Units 06/28/2023   10:10 AM 03/02/2023    6:30 PM 12/25/2022    9:00 AM  CBC EXTENDED  WBC 3.4 - 10.8 x10E3/uL 6.0  6.1  7.0   RBC 4.14 - 5.80 x10E6/uL 4.73  4.95  4.84   Hemoglobin 13.0 - 17.7 g/dL 11.9  14.7  82.9   HCT 37.5 - 51.0 % 40.9  41.6  40.8   Platelets 150 - 450 x10E3/uL 178  189  161   NEUT# 1.7 - 7.7 K/uL  3.5    Lymph# 0.7 - 4.0 K/uL  2.1        ASSESSMENT AND PLAN:   Esophagitis with elevated eosinophils Dysphagia improved  with PPI but persistent. Inflammation with eosinophils in distal esophagus suggests reflux esophagitis over eosinophilic esophagitis, but infrequent typical GERD symptoms and dysphagia as primary symptom are more suggestive of EoE.  Plan to repeat EGD and biopsies after period of BID PPI - Increase omeprazole  to twice daily, 30-45 minutes before breakfast and dinner. - Schedule repeat endoscopy and biopsy to assess inflammation after 4 weeks of BID PPI.  Borderling gallbladder dysfunction Previous HIDA scan showed bordeline gallbladder emptying.  - Consider surgical consultation for cholecystectomy if symptoms of nausea and bloating persist.  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 Symptoms infrequent. - Provided reassurance regarding anal spasms.  Recording duration: 8 minutes     Gianella Chismar E. Cherryl Corona, MD [facility]    Aida House, MD

## 2023-09-21 ENCOUNTER — Encounter: Payer: Self-pay | Admitting: Gastroenterology

## 2023-09-29 ENCOUNTER — Ambulatory Visit: Admitting: Gastroenterology

## 2023-09-29 ENCOUNTER — Encounter: Payer: Self-pay | Admitting: Gastroenterology

## 2023-09-29 VITALS — BP 110/77 | HR 81 | Temp 98.2°F | Resp 14 | Ht 67.0 in | Wt 164.0 lb

## 2023-09-29 DIAGNOSIS — Z8719 Personal history of other diseases of the digestive system: Secondary | ICD-10-CM

## 2023-09-29 DIAGNOSIS — K209 Esophagitis, unspecified without bleeding: Secondary | ICD-10-CM | POA: Diagnosis not present

## 2023-09-29 DIAGNOSIS — R1319 Other dysphagia: Secondary | ICD-10-CM

## 2023-09-29 DIAGNOSIS — R1310 Dysphagia, unspecified: Secondary | ICD-10-CM | POA: Diagnosis present

## 2023-09-29 MED ORDER — SODIUM CHLORIDE 0.9 % IV SOLN: 500.0000 mL | Freq: Once | INTRAVENOUS | Status: DC

## 2023-09-29 NOTE — Op Note (Addendum)
 [facility] Patient Name: George Peterson Procedure Date: 09/29/2023 4:09 PM MRN: [id number] Endoscopist: Glendia E. Stacia , MD, [id number] Age: 38 Referring MD:  Date of Birth: 09/23/1985 Gender: Male Account #: [id number] Procedure:                Upper GI endoscopy Indications:              Dysphagia, Exclusion of eosinophilic esophagitis Medicines:                Monitored Anesthesia Care Procedure:                Pre-Anesthesia Assessment:                           - Prior to the procedure, a History and Physical                            was performed, and patient medications and                            allergies were reviewed. The patient's tolerance of                            previous anesthesia was also reviewed. The risks                            and benefits of the procedure and the sedation                            options and risks were discussed with the patient.                            All questions were answered, and informed consent                            was obtained. Prior Anticoagulants: The patient has                            taken no anticoagulant or antiplatelet agents. ASA                            Grade Assessment: II - A patient with mild systemic                            disease. After reviewing the risks and benefits,                            the patient was deemed in satisfactory condition to                            undergo the procedure.                           After obtaining informed consent, the endoscope was  passed under direct vision. Throughout the                            procedure, the patient's blood pressure, pulse, and                            oxygen saturations were monitored continuously. The                            GIF [id number] #[id number] was introduced through the                            mouth, and advanced to the second part of duodenum.                             The upper GI endoscopy was accomplished without                            difficulty. The patient tolerated the procedure                            well. Scope In: Scope Out: Findings:                 The examined esophagus was normal.                           The entire examined stomach was normal.                           The examined duodenum was normal. Complications:            No immediate complications. Estimated Blood Loss:     Estimated blood loss was minimal. Impression:               - Normal esophagus.                           - Normal stomach.                           - Normal examined duodenum.                           - No specimens collected. Recommendation:           - Patient has a contact number available for                            emergencies. The signs and symptoms of potential                            delayed complications were discussed with the                            patient. Return to normal activities tomorrow.  Written discharge instructions were provided to the                            patient.                           - Resume previous diet.                           - Continue present medications.                           - Await pathology results.                           - Further recommendations will be based on                            pathology results. George Peterson E. Stacia, MD 09/29/2023 4:32:43 PM This report has been signed electronically. Addendum Number: 1   Addendum Date: 10/16/2023 8:25:51 AM      Esophageal biopsies were taken from the distal and proximal esophagus to       evaluate for eosinophilic esophagitis. Estimated blood loss was minimal. George Peterson E. Stacia, MD 10/16/2023 8:27:35 AM This report has been signed electronically.

## 2023-09-29 NOTE — Progress Notes (Unsigned)
 History and Physical Interval Note:  09/29/2023 4:15 PM  George Peterson  has presented today for endoscopic procedure(s), with the diagnosis of  Encounter Diagnosis  Name Primary?   Esophagitis Yes  .  The various methods of evaluation and treatment have been discussed with the patient and/or family. After consideration of risks, benefits and other options for treatment, the patient has consented to  the endoscopic procedure(s).   The patient's history has been reviewed, patient examined, no change in status, stable for endoscopic procedure(s).  I have reviewed the patient's chart and labs.  Questions were answered to the patient's satisfaction.     Chimene Salo E. Stacia, MD [facility]

## 2023-09-29 NOTE — Progress Notes (Signed)
 Called to room to assist during endoscopic procedure.  Patient ID and intended procedure confirmed with present staff. Received instructions for my participation in the procedure from the performing physician.

## 2023-09-29 NOTE — Patient Instructions (Signed)
Discharge instructions given. Normal exam. Resume previous medications. YOU HAD AN ENDOSCOPIC PROCEDURE TODAY AT THE [facility]:   Refer to the procedure report that was given to you for any specific questions about what was found during the examination.  If the procedure report does not answer your questions, please call your gastroenterologist to clarify.  If you requested that your care partner not be given the details of your procedure findings, then the procedure report has been included in a sealed envelope for you to review at your convenience later.  YOU SHOULD EXPECT: Some feelings of bloating in the abdomen. Passage of more gas than usual.  Walking can help get rid of the air that was put into your GI tract during the procedure and reduce the bloating. If you had a lower endoscopy (such as a colonoscopy or flexible sigmoidoscopy) you may notice spotting of blood in your stool or on the toilet paper. If you underwent a bowel prep for your procedure, you may not have a normal bowel movement for a few days.  Please Note:  You might notice some irritation and congestion in your nose or some drainage.  This is from the oxygen used during your procedure.  There is no need for concern and it should clear up in a day or so.  SYMPTOMS TO REPORT IMMEDIATELY:  Following upper endoscopy (EGD)  Vomiting of blood or coffee ground material  New chest pain or pain under the shoulder blades  Painful or persistently difficult swallowing  New shortness of breath  Fever of 100F or higher  Black, tarry-looking stools  For urgent or emergent issues, a gastroenterologist can be reached at any hour by calling [phone]. Do not use MyChart messaging for urgent concerns.    DIET:  We do recommend a small meal at first, but then you may proceed to your regular diet.  Drink plenty of fluids but you should avoid alcoholic beverages for 24 hours.  ACTIVITY:  You should plan to take it easy  for the rest of today and you should NOT DRIVE or use heavy machinery until tomorrow (because of the sedation medicines used during the test).    FOLLOW UP: Our staff will call the number listed on your records the next business day following your procedure.  We will call around 7:15- 8:00 am to check on you and address any questions or concerns that you may have regarding the information given to you following your procedure. If we do not reach you, we will leave a message.     If any biopsies were taken you will be contacted by phone or by letter within the next 1-3 weeks.  Please call us at [phone] if you have not heard about the biopsies in 3 weeks.    SIGNATURES/CONFIDENTIALITY: You and/or your care partner have signed paperwork which will be entered into your electronic medical record.  These signatures attest to the fact that that the information above on your After Visit Summary has been reviewed and is understood.  Full responsibility of the confidentiality of this discharge information lies with you and/or your care-partner. 

## 2023-09-29 NOTE — Progress Notes (Unsigned)
 To pacu, VSS. Report to [email or web address]

## 2023-09-30 ENCOUNTER — Telehealth: Payer: Self-pay | Admitting: Nurse Practitioner

## 2023-09-30 NOTE — Telephone Encounter (Signed)
 Patient called the answering service this evening.  He had an upper endoscopy by Dr. Stacia on 09/29/23.   Today he has been experiencing some mild discomfort just below his right shoulder blade.  He has no associated abdominal pain.  No nausea, vomiting, fever, shortness of breath.    The upper endoscopy was completely normal.  No interventions done, no biopsies taken.  Results reviewed with the patient, he felt reassured but just wanted to call since post procedure instructions recommended he call if having any issues  I advised him to call back if symptoms worsened or he develops new symptoms.

## 2023-10-02 ENCOUNTER — Telehealth: Payer: Self-pay

## 2023-10-02 NOTE — Telephone Encounter (Signed)
 Follow up call to pt, lm for pt to call if having any difficulty with normal activities or eating and drinking.  Also to call if any other questions or concerns.

## 2023-10-04 LAB — SURGICAL PATHOLOGY

## 2023-10-09 ENCOUNTER — Ambulatory Visit: Payer: Self-pay | Admitting: Gastroenterology

## 2023-10-09 NOTE — Progress Notes (Signed)
 [place],  The biopsies of your esophagus were normal.  The inflammatory changes previously seen have healed.  I would continue to take the omeprazole  once daily for the next month.  If your symptoms remain well controlled, try taking it every other day for 2 weeks and then stopping if your symptoms do not return.

## 2023-11-28 ENCOUNTER — Telehealth: Admitting: Physician Assistant

## 2023-11-28 ENCOUNTER — Encounter

## 2023-11-28 DIAGNOSIS — U071 COVID-19: Secondary | ICD-10-CM | POA: Diagnosis not present

## 2023-11-28 MED ORDER — NIRMATRELVIR/RITONAVIR (PAXLOVID)TABLET: 3.0000 | ORAL_TABLET | Freq: Two times a day (BID) | ORAL | 0 refills | Status: AC

## 2023-11-28 NOTE — Patient Instructions (Signed)
 George Peterson, thank you for joining George Velma Lunger, PA-C for today's virtual visit.  While this provider is not your primary care provider (PCP), if your PCP is located in our provider database this encounter information will be shared with them immediately following your visit.   A [facility] MyChart account gives you access to today's visit and all your visits, tests, and labs performed at [facility]  click here if you don't have a [facility] MyChart account or go to [email or web address]  Consent: (Patient) George Peterson provided verbal consent for this virtual visit at the beginning of the encounter.  Current Medications:  Current Outpatient Medications:    acetaminophen  (TYLENOL ) 500 MG tablet, Take 500 mg by mouth every 6 (six) hours as needed for headache. , Disp: , Rfl:    finasteride (PROPECIA) 1 MG tablet, Take 1 mg by mouth daily., Disp: , Rfl:    fluticasone (FLONASE) 50 MCG/ACT nasal spray, Place 1 spray into both nostrils daily as needed for allergies or rhinitis., Disp: , Rfl:    ibuprofen (ADVIL) 600 MG tablet, Take 600 mg by mouth 4 (four) times daily., Disp: , Rfl:    loratadine (CLARITIN) 10 MG tablet, Take 10 mg by mouth daily., Disp: , Rfl:    Multiple Vitamin (MULTIVITAMIN WITH MINERALS) TABS tablet, Take 1 tablet by mouth daily., Disp: , Rfl:    omeprazole  (PRILOSEC) 20 MG capsule, Take 1 capsule (20 mg total) by mouth 2 (two) times daily., Disp: 180 capsule, Rfl: 3   SKYRIZI PEN 150 MG/ML pen, Inject 150 mg into the skin See admin instructions. Every 12 weeks, Disp: , Rfl:    Medications ordered in this encounter:  No orders of the defined types were placed in this encounter.    *If you need refills on other medications prior to your next appointment, please contact your pharmacy*  Follow-Up: Call back or seek an in-person evaluation if the symptoms worsen or if the condition fails to improve as anticipated.  [facility]  [phone]  Care Instructions: Please keep well-hydrated and get plenty of rest. Start a saline nasal rinse to flush out your nasal passages. You can use plain Mucinex to help thin congestion. If you have a humidifier, running in the bedroom at night. Please take prescribed medications as directed.  Isolation Instructions: You are to isolate at home until you have been fever free for at least 24 hours without a fever-reducing medication, and symptoms have been steadily improving for 24 hours. At that time,  you can end isolation but need to mask for an additional 5 days.   If you must be around other household members who do not have symptoms, you need to make sure that both you and the family members are masking consistently with a high-quality mask.  If you note any worsening of symptoms despite treatment, please seek an in-person evaluation ASAP. If you note any significant shortness of breath or any chest pain, please seek ER evaluation. Please do not delay care!   COVID-19: What to Do if You Are Sick If you test positive and are an older adult or someone who is at high risk of getting very sick from COVID-19, treatment may be available. Contact a healthcare provider right away after a positive test to determine if you are eligible, even if your symptoms are mild right now. You can also visit a Test to Treat location and, if eligible, receive a prescription from a provider. Don't delay: Treatment  must be started within the first few days to be effective. If you have a fever, cough, or other symptoms, you might have COVID-19. Most people have mild illness and are able to recover at home. If you are sick: Keep track of your symptoms. If you have an emergency warning sign (including trouble breathing), call 911. Steps to help prevent the spread of COVID-19 if you are sick If you are sick with COVID-19 or think you might have COVID-19, follow the steps below to care for yourself and to  help protect other people in your home and community. Stay home except to get medical care Stay home. Most people with COVID-19 have mild illness and can recover at home without medical care. Do not leave your home, except to get medical care. Do not visit public areas and do not go to places where you are unable to wear a mask. Take care of yourself. Get rest and stay hydrated. Take over-the-counter medicines, such as acetaminophen , to help you feel better. Stay in touch with your doctor. Call before you get medical care. Be sure to get care if you have trouble breathing, or have any other emergency warning signs, or if you think it is an emergency. Avoid public transportation, ride-sharing, or taxis if possible. Get tested If you have symptoms of COVID-19, get tested. While waiting for test results, stay away from others, including staying apart from those living in your household. Get tested as soon as possible after your symptoms start. Treatments may be available for people with COVID-19 who are at risk for becoming very sick. Don't delay: Treatment must be started early to be effective--some treatments must begin within 5 days of your first symptoms. Contact your healthcare provider right away if your test result is positive to determine if you are eligible. Self-tests are one of several options for testing for the virus that causes COVID-19 and may be more convenient than laboratory-based tests and point-of-care tests. Ask your healthcare provider or your local health department if you need help interpreting your test results. You can visit your state, tribal, local, and territorial health department's website to look for the latest local information on testing sites. Separate yourself from other people As much as possible, stay in a specific room and away from other people and pets in your home. If possible, you should use a separate bathroom. If you need to be around other people or animals in  or outside of the home, wear a well-fitting mask. Tell your close contacts that they may have been exposed to COVID-19. An infected person can spread COVID-19 starting 48 hours (or 2 days) before the person has any symptoms or tests positive. By letting your close contacts know they may have been exposed to COVID-19, you are helping to protect everyone. See COVID-19 and Animals if you have questions about pets. If you are diagnosed with COVID-19, someone from the health department may call you. Answer the call to slow the spread. Monitor your symptoms Symptoms of COVID-19 include fever, cough, or other symptoms. Follow care instructions from your healthcare provider and local health department. Your local health authorities may give instructions on checking your symptoms and reporting information. When to seek emergency medical attention Look for emergency warning signs* for COVID-19. If someone is showing any of these signs, seek emergency medical care immediately: Trouble breathing Persistent pain or pressure in the chest New confusion Inability to wake or stay awake Pale, gray, or blue-colored skin, lips, or nail beds,  depending on skin tone *This list is not all possible symptoms. Please call your medical provider for any other symptoms that are severe or concerning to you. Call 911 or call ahead to your local emergency facility: Notify the operator that you are seeking care for someone who has or may have COVID-19. Call ahead before visiting your doctor Call ahead. Many medical visits for routine care are being postponed or done by phone or telemedicine. If you have a medical appointment that cannot be postponed, call your doctor's office, and tell them you have or may have COVID-19. This will help the office protect themselves and other patients. If you are sick, wear a well-fitting mask You should wear a mask if you must be around other people or animals, including pets (even at  home). Wear a mask with the best fit, protection, and comfort for you. You don't need to wear the mask if you are alone. If you can't put on a mask (because of trouble breathing, for example), cover your coughs and sneezes in some other way. Try to stay at least 6 feet away from other people. This will help protect the people around you. Masks should not be placed on young children under age 2 years, anyone who has trouble breathing, or anyone who is not able to remove the mask without help. Cover your coughs and sneezes Cover your mouth and nose with a tissue when you cough or sneeze. Throw away used tissues in a lined trash can. Immediately wash your hands with soap and water for at least 20 seconds. If soap and water are not available, clean your hands with an alcohol-based hand sanitizer that contains at least 60% alcohol. Clean your hands often Wash your hands often with soap and water for at least 20 seconds. This is especially important after blowing your nose, coughing, or sneezing; going to the bathroom; and before eating or preparing food. Use hand sanitizer if soap and water are not available. Use an alcohol-based hand sanitizer with at least 60% alcohol, covering all surfaces of your hands and rubbing them together until they feel dry. Soap and water are the best option, especially if hands are visibly dirty. Avoid touching your eyes, nose, and mouth with unwashed hands. Handwashing Tips Avoid sharing personal household items Do not share dishes, drinking glasses, cups, eating utensils, towels, or bedding with other people in your home. Wash these items thoroughly after using them with soap and water or put in the dishwasher. Clean surfaces in your home regularly Clean and disinfect high-touch surfaces (for example, doorknobs, tables, handles, light switches, and countertops) in your sick room and bathroom. In shared spaces, you should clean and disinfect surfaces and items after each  use by the person who is ill. If you are sick and cannot clean, a caregiver or other person should only clean and disinfect the area around you (such as your bedroom and bathroom) on an as needed basis. Your caregiver/other person should wait as long as possible (at least several hours) and wear a mask before entering, cleaning, and disinfecting shared spaces that you use. Clean and disinfect areas that may have blood, stool, or body fluids on them. Use household cleaners and disinfectants. Clean visible dirty surfaces with household cleaners containing soap or detergent. Then, use a household disinfectant. Use a product from [employer] List N: Disinfectants for Coronavirus (COVID-19). Be sure to follow the instructions on the label to ensure safe and effective use of the product. Many products recommend  keeping the surface wet with a disinfectant for a certain period of time (look at contact time on the product label). You may also need to wear personal protective equipment, such as gloves, depending on the directions on the product label. Immediately after disinfecting, wash your hands with soap and water for 20 seconds. For completed guidance on cleaning and disinfecting your home, visit Complete Disinfection Guidance. Take steps to improve ventilation at home Improve ventilation (air flow) at home to help prevent from spreading COVID-19 to other people in your household. Clear out COVID-19 virus particles in the air by opening windows, using air filters, and turning on fans in your home. Use this interactive tool to learn how to improve air flow in your home. When you can be around others after being sick with COVID-19 Deciding when you can be around others is different for different situations. Find out when you can safely end home isolation. For any additional questions about your care, contact your healthcare provider or state or local health department. 06/09/2020 Content source: [facility] for Immunization and Respiratory Diseases ([facility]), Division of Viral Diseases This information is not intended to replace advice given to you by your health care provider. Make sure you discuss any questions you have with your health care provider. Document Revised: 07/23/2020 Document Reviewed: 07/23/2020 Elsevier Patient Education  2022 [employer].  If you have been instructed to have an in-person evaluation today at a local Urgent Care facility, please use the link below. It will take you to a list of all of our available [facility], including address, phone number and hours of operation. Please do not delay care.  [facility]  If you or a family member do not have a primary care provider, use the link below to schedule a visit and establish care. When you choose a [facility] primary care physician or advanced practice provider, you gain a long-term partner in health. Find a Primary Care Provider  Learn more about [facility]'s in-office and virtual care options: [facility] Get Care Now

## 2023-11-28 NOTE — Progress Notes (Signed)
 Virtual Visit Consent   George Peterson, you are scheduled for a virtual visit with a [facility] provider today. Just as with appointments in the office, your consent must be obtained to participate. Your consent will be active for this visit and any virtual visit you may have with one of our providers in the next 365 days. If you have a MyChart account, a copy of this consent can be sent to you electronically.  As this is a virtual visit, video technology does not allow for your provider to perform a traditional examination. This may limit your provider's ability to fully assess your condition. If your provider identifies any concerns that need to be evaluated in person or the need to arrange testing (such as labs, EKG, etc.), we will make arrangements to do so. Although advances in technology are sophisticated, we cannot ensure that it will always work on either your end or our end. If the connection with a video visit is poor, the visit may have to be switched to a telephone visit. With either a video or telephone visit, we are not always able to ensure that we have a secure connection.  By engaging in this virtual visit, you consent to the provision of healthcare and authorize for your insurance to be billed (if applicable) for the services provided during this visit. Depending on your insurance coverage, you may receive a charge related to this service.  I need to obtain your verbal consent now. Are you willing to proceed with your visit today? George Peterson has provided verbal consent on 11/28/2023 for a virtual visit (video or telephone). George Peterson, [place]  Date: 11/28/2023 2:24 PM   Virtual Visit via Video Note   I, George Peterson, connected with  George Peterson  ([id number], 06-07-1985) on 11/28/23 at  2:15 PM EDT by a video-enabled telemedicine application and verified that I am speaking with the correct person using two identifiers.  Location: Patient: Virtual Visit Location  Patient: Home Provider: Virtual Visit Location Provider: Home Office   I discussed the limitations of evaluation and management by telemedicine and the availability of in person appointments. The patient expressed understanding and agreed to proceed.    History of Present Illness: George Peterson is a 38 y.o. who identifies as a male who was assigned male at birth, and is being seen today for COVID-19. Endorses Friday night into Saturday morning with mild symptoms -- rhinorrhea, sore throat and mild cough. This morning with headache and fatigue. Took an at-home test this morning which was positive. Denies CP, SOB or GI symptoms.  HPI: HPI  Problems:  Patient Active Problem List   Diagnosis Date Noted   ASD (atrial septal defect) 06/29/2023   TIA (transient ischemic attack) 06/28/2023   Psoriasis 09/07/2018   Nephrolithiasis 09/07/2018   Exposure to COVID-19 virus 09/07/2018    Allergies: No Known Allergies Medications:  Current Outpatient Medications:    nirmatrelvir /ritonavir  (PAXLOVID ) 20 x 150 MG & 10 x 100MG  TABS, Take 3 tablets by mouth 2 (two) times daily for 5 days. (Take nirmatrelvir  150 mg two tablets twice daily for 5 days and ritonavir  100 mg one tablet twice daily for 5 days) Patient GFR is > 100, Disp: 30 tablet, Rfl: 0   acetaminophen  (TYLENOL ) 500 MG tablet, Take 500 mg by mouth every 6 (six) hours as needed for headache. , Disp: , Rfl:    finasteride (PROPECIA) 1 MG tablet, Take 1 mg by mouth daily., Disp: , Rfl:  fluticasone (FLONASE) 50 MCG/ACT nasal spray, Place 1 spray into both nostrils daily as needed for allergies or rhinitis., Disp: , Rfl:    ibuprofen (ADVIL) 600 MG tablet, Take 600 mg by mouth 4 (four) times daily., Disp: , Rfl:    loratadine (CLARITIN) 10 MG tablet, Take 10 mg by mouth daily., Disp: , Rfl:    Multiple Vitamin (MULTIVITAMIN WITH MINERALS) TABS tablet, Take 1 tablet by mouth daily., Disp: , Rfl:    omeprazole  (PRILOSEC) 20 MG capsule, Take 1  capsule (20 mg total) by mouth 2 (two) times daily., Disp: 180 capsule, Rfl: 3   SKYRIZI PEN 150 MG/ML pen, Inject 150 mg into the skin See admin instructions. Every 12 weeks, Disp: , Rfl:   Observations/Objective: Patient is well-developed, well-nourished in no acute distress.  Resting comfortably at home.  Head is normocephalic, atraumatic.  No labored breathing.  Speech is clear and coherent with logical content.  Patient is alert and oriented at baseline.   Assessment and Plan: 1. COVID-19 (Primary) - nirmatrelvir /ritonavir  (PAXLOVID ) 20 x 150 MG & 10 x 100MG  TABS; Take 3 tablets by mouth 2 (two) times daily for 5 days. (Take nirmatrelvir  150 mg two tablets twice daily for 5 days and ritonavir  100 mg one tablet twice daily for 5 days) Patient GFR is > 100  Dispense: 30 tablet; Refill: 0   Discussed risks/benefits of antiviral medications including most common potential ADRs. Patient voiced understanding and would like to proceed with antiviral medication. They are candidate for Paxlovid . Rx sent to pharmacy. Supportive measures, OTC medications and vitamin regimen reviewed. Quarantine reviewed in detail. Strict ER precautions discussed with patient.    Follow Up Instructions: I discussed the assessment and treatment plan with the patient. The patient was provided an opportunity to ask questions and all were answered. The patient agreed with the plan and demonstrated an understanding of the instructions.  A copy of instructions were sent to the patient via MyChart unless otherwise noted below.   The patient was advised to call back or seek an in-person evaluation if the symptoms worsen or if the condition fails to improve as anticipated.    George Velma Lunger, PA-C

## 2023-12-24 ENCOUNTER — Emergency Department (HOSPITAL_BASED_OUTPATIENT_CLINIC_OR_DEPARTMENT_OTHER)
Admission: EM | Admit: 2023-12-24 | Discharge: 2023-12-24 | Disposition: A | Discharge status: Home / Self Care | Attending: Emergency Medicine | Admitting: Emergency Medicine

## 2023-12-24 ENCOUNTER — Encounter (HOSPITAL_BASED_OUTPATIENT_CLINIC_OR_DEPARTMENT_OTHER): Payer: Self-pay | Admitting: Emergency Medicine

## 2023-12-24 ENCOUNTER — Other Ambulatory Visit: Payer: Self-pay

## 2023-12-24 ENCOUNTER — Emergency Department (HOSPITAL_BASED_OUTPATIENT_CLINIC_OR_DEPARTMENT_OTHER)

## 2023-12-24 DIAGNOSIS — R55 Syncope and collapse: Secondary | ICD-10-CM | POA: Diagnosis present

## 2023-12-24 DIAGNOSIS — E876 Hypokalemia: Secondary | ICD-10-CM | POA: Insufficient documentation

## 2023-12-24 DIAGNOSIS — S0219XA Other fracture of base of skull, initial encounter for closed fracture: Secondary | ICD-10-CM | POA: Insufficient documentation

## 2023-12-24 DIAGNOSIS — W1839XA Other fall on same level, initial encounter: Secondary | ICD-10-CM | POA: Diagnosis not present

## 2023-12-24 DIAGNOSIS — S0990XA Unspecified injury of head, initial encounter: Secondary | ICD-10-CM | POA: Diagnosis present

## 2023-12-24 DIAGNOSIS — Z8616 Personal history of COVID-19: Secondary | ICD-10-CM | POA: Diagnosis not present

## 2023-12-24 LAB — CBC WITH DIFFERENTIAL/PLATELET
Abs Immature Granulocytes: 0.02 K/uL (ref 0.00–0.07)
Basophils Absolute: 0 K/uL (ref 0.0–0.1)
Basophils Relative: 1 %
Eosinophils Absolute: 0.1 K/uL (ref 0.0–0.5)
Eosinophils Relative: 2 %
HCT: 39.3 % (ref 39.0–52.0)
Hemoglobin: 14.2 g/dL (ref 13.0–17.0)
Immature Granulocytes: 0 %
Lymphocytes Relative: 39 %
Lymphs Abs: 2.5 K/uL (ref 0.7–4.0)
MCH: 29.6 pg (ref 26.0–34.0)
MCHC: 36.1 g/dL — ABNORMAL HIGH (ref 30.0–36.0)
MCV: 81.9 fL (ref 80.0–100.0)
Monocytes Absolute: 0.4 K/uL (ref 0.1–1.0)
Monocytes Relative: 6 %
Neutro Abs: 3.4 K/uL (ref 1.7–7.7)
Neutrophils Relative %: 52 %
Platelets: 141 K/uL — ABNORMAL LOW (ref 150–400)
RBC: 4.8 MIL/uL (ref 4.22–5.81)
RDW: 11.9 % (ref 11.5–15.5)
WBC: 6.5 K/uL (ref 4.0–10.5)
nRBC: 0 % (ref 0.0–0.2)

## 2023-12-24 LAB — COMPREHENSIVE METABOLIC PANEL WITH GFR
ALT: 28 U/L (ref 0–44)
AST: 26 U/L (ref 15–41)
Albumin: 4.7 g/dL (ref 3.5–5.0)
Alkaline Phosphatase: 65 U/L (ref 38–126)
Anion gap: 11 (ref 5–15)
BUN: 13 mg/dL (ref 6–20)
CO2: 27 mmol/L (ref 22–32)
Calcium: 10.3 mg/dL (ref 8.9–10.3)
Chloride: 102 mmol/L (ref 98–111)
Creatinine, Ser: 0.89 mg/dL (ref 0.61–1.24)
GFR, Estimated: >60 mL/min (ref >60)
Glucose, Bld: 114 mg/dL — ABNORMAL HIGH (ref 70–99)
Potassium: 3.2 mmol/L — ABNORMAL LOW (ref 3.5–5.1)
Sodium: 140 mmol/L (ref 135–145)
Total Bilirubin: 0.8 mg/dL (ref 0.0–1.2)
Total Protein: 7.3 g/dL (ref 6.5–8.1)

## 2023-12-24 LAB — PRO BRAIN NATRIURETIC PEPTIDE: Pro Brain Natriuretic Peptide: <50 pg/mL (ref <300.0)

## 2023-12-24 LAB — D-DIMER, QUANTITATIVE: D-Dimer, Quant: <0.27 ug{FEU}/mL (ref 0.00–0.50)

## 2023-12-24 LAB — TROPONIN T, HIGH SENSITIVITY
Troponin T High Sensitivity: <15 ng/L (ref 0–19)
Troponin T High Sensitivity: <15 ng/L (ref 0–19)

## 2023-12-24 MED ORDER — LACTATED RINGERS IV BOLUS
1000.0000 mL | Freq: Once | INTRAVENOUS | Status: AC
  Administered 2023-12-24: 1000 mL via INTRAVENOUS

## 2023-12-24 MED ORDER — ONDANSETRON HCL 4 MG/2ML IJ SOLN
4.0000 mg | Freq: Once | INTRAMUSCULAR | Status: AC
  Administered 2023-12-24: 4 mg via INTRAVENOUS
  Filled 2023-12-24: qty 2

## 2023-12-24 MED ORDER — IOHEXOL 350 MG/ML SOLN
75.0000 mL | Freq: Once | INTRAVENOUS | Status: AC | PRN
  Administered 2023-12-24: 75 mL via INTRAVENOUS

## 2023-12-24 MED ORDER — POTASSIUM CHLORIDE CRYS ER 20 MEQ PO TBCR
40.0000 meq | EXTENDED_RELEASE_TABLET | Freq: Once | ORAL | Status: AC
  Administered 2023-12-24: 40 meq via ORAL
  Filled 2023-12-24: qty 2

## 2023-12-24 NOTE — ED Notes (Signed)
 Patient transported to CT

## 2023-12-24 NOTE — ED Provider Notes (Signed)
 Called to room by RN. Patient with an episode of syncope with rapid HR earlier today admitted for observation overnight. He has been asymptomatic while awaiting inpatient bed, has been up walking and eating and drinking without difficulty.He does not want to stay for observation and is requesting discharge. He already has PCP follow up scheduled, will give referral to cardiology as well. He understands the need to call EMS and/or RTED for any return of symptoms. He is aware of risk of dysrhythmia, recurrent syncope, etc.    Roselyn Carlin NOVAK, MD 12/24/23 1311

## 2023-12-24 NOTE — ED Notes (Signed)
 Pt discharged home and given discharge paperwork. Opportunities given for questions. Pt verbalizes understanding. PIV removed x1. Bethena Powell SAUNDERS , RN

## 2023-12-24 NOTE — Plan of Care (Signed)
 Plan of Care Note for accepted transfer   Patient name: George Peterson FMW:[id number] DOB: 01-19-1986  Facility requesting transfer: [facility] Requesting Provider: Dr. Carita Facility course: 38 year old male with history of nephrolithiasis, psoriasis, esophagitis, small atrial septal defect and mild mitral regurgitation per echo done in April 2025 presented for evaluation of palpitations, shortness of breath, nausea, and syncope resulting in head injury.  No seizure-like activity reported. No chest pain reported. Vital signs: Temperature 98.2 F, pulse 91, respiratory rate 20, blood pressure 143/98, and SpO2 100% on room air.  EKG showing sinus rhythm and no acute ischemic changes.  Labs showing no leukocytosis or anemia, platelet count 141k, potassium 3.2, glucose 114, creatinine 0.89, initial troponin negative and repeat pending, proBNP <50.0.  CT head showing new nondisplaced fracture of the right anterior table of the frontal sinus with mild overlying soft tissue swelling.  CTA chest negative for PE or acute pulmonary abnormality.  Patient was given Zofran  and 1 L LR.  Plan of care: The patient is accepted for admission to Progressive unit at [facility].  [facility] will assume care on arrival to accepting facility. Until arrival, care as per EDP. However, [facility] available 24/7 for questions and assistance.  Check [email or web address] for on-call coverage.  Nursing staff, please call [facility] Admits & Consults System-Wide number under Amion on patient's arrival so appropriate admitting provider can evaluate the pt.

## 2023-12-24 NOTE — ED Triage Notes (Addendum)
 Patient reports he woke up at 3:30 and has palpitations, shortness of breath, and nausea. Reports that he had syncopal episode while sitting off the side of the bed. Unwitnessed fall but patient reports forehead is hurting and he is having headache so he assumes he hit his head.   Recent travel to the [place] .  9/20 - started oral minoxidil and stopped taking on 10/3 because it made his chest and breathing feel weird.

## 2023-12-24 NOTE — ED Provider Notes (Signed)
 [place] EMERGENCY DEPARTMENT AT [facility] Provider Note   CSN: [id number] Arrival date & time: 12/24/23  0401     Patient presents with: Loss of Consciousness   George Peterson is a 38 y.o. male.   Patient with history of psoriasis, possible TIA, atrial septal defect no recent echocardiogram here with episode of loss of consciousness.  States he felt fine before going to bed last night.  He woke up about 3 AM feeling warm with a racing heart feeling and palpitations.  He got up to turn on the air conditioner went back to bed but continued to feel hot and nauseous with racing heart.  He got up to sit on the edge of the bed and began to feel short of breath.  He then believes he fell to the floor striking his head and lost consciousness.  Wife found him to be at his back and was slow to respond initially.  There is no seizure activity.  No tongue biting or incontinence.  Has a hematoma to his forehead.  Did not have a headache prior to the fall.  Denies any other injury.  No neck or back pain.  Still feeling nauseous and short of breath but this is improving.  Had COVID about 1 month ago.  Was on p.o. minoxidil for hair thinning up until a couple days ago.  He had been on minoxidil since September 20 and stopped taking it on October 3 because it was making him feel weird.  Did have recent air travel.  The history is provided by the patient and the spouse.  Loss of Consciousness Associated symptoms: headaches, nausea and shortness of breath   Associated symptoms: no chest pain, no dizziness, no fever, no vomiting and no weakness        Prior to Admission medications   Medication Sig Start Date End Date Taking? Authorizing Provider  acetaminophen  (TYLENOL ) 500 MG tablet Take 500 mg by mouth every 6 (six) hours as needed for headache.     [provider]  finasteride (PROPECIA) 1 MG tablet Take 1 mg by mouth daily.    [provider]  fluticasone (FLONASE) 50  MCG/ACT nasal spray Place 1 spray into both nostrils daily as needed for allergies or rhinitis.    [provider]  ibuprofen (ADVIL) 600 MG tablet Take 600 mg by mouth 4 (four) times daily. 06/21/23   [provider]  loratadine (CLARITIN) 10 MG tablet Take 10 mg by mouth daily.    [provider]  Multiple Vitamin (MULTIVITAMIN WITH MINERALS) TABS tablet Take 1 tablet by mouth daily.    [provider]  omeprazole  (PRILOSEC) 20 MG capsule Take 1 capsule (20 mg total) by mouth 2 (two) times daily. 08/31/23   Stacia Glendia BRAVO, MD  SKYRIZI PEN 150 MG/ML pen Inject 150 mg into the skin See admin instructions. Every 12 weeks 11/10/22   [provider]    Allergies: Patient has no known allergies.    Review of Systems  Constitutional:  Negative for activity change, appetite change and fever.  HENT:  Negative for congestion and rhinorrhea.   Respiratory:  Positive for shortness of breath. Negative for chest tightness.   Cardiovascular:  Positive for syncope. Negative for chest pain.  Gastrointestinal:  Positive for nausea. Negative for vomiting.  Genitourinary:  Negative for dysuria and hematuria.  Musculoskeletal:  Negative for arthralgias and myalgias.  Skin:  Negative for rash.  Neurological:  Positive for headaches. Negative for  dizziness and weakness.   all other systems are negative except as noted in the HPI and PMH.    Updated Vital Signs BP (!) 143/98   Pulse 91   Temp 98.2 F (36.8 C) (Oral)   Resp 20   Ht 5' 8 (1.727 m)   Wt 74.8 kg   SpO2 100%   BMI 25.09 kg/m   Physical Exam Vitals and nursing note reviewed.  Constitutional:      General: He is not in acute distress.    Appearance: He is well-developed.  HENT:     Head: Normocephalic.     Comments: Hematoma right forehead    Mouth/Throat:     Pharynx: No oropharyngeal exudate.  Eyes:     Conjunctiva/sclera: Conjunctivae normal.     Pupils: Pupils are equal, round, and  reactive to light.  Neck:     Comments: No C-spine tenderness Cardiovascular:     Rate and Rhythm: Normal rate and regular rhythm.     Heart sounds: Normal heart sounds. No murmur heard. Pulmonary:     Effort: Pulmonary effort is normal. No respiratory distress.     Breath sounds: Normal breath sounds.  Abdominal:     Palpations: Abdomen is soft.     Tenderness: There is no abdominal tenderness. There is no guarding or rebound.  Musculoskeletal:        General: No tenderness. Normal range of motion.     Cervical back: Normal range of motion and neck supple.  Skin:    General: Skin is warm.  Neurological:     Mental Status: He is alert and oriented to person, place, and time.     Cranial Nerves: No cranial nerve deficit.     Motor: No abnormal muscle tone.     Coordination: Coordination normal.     Comments:  5/5 strength throughout. CN 2-12 intact.Equal grip strength.   Psychiatric:        Behavior: Behavior normal.     (all labs ordered are listed, but only abnormal results are displayed) Labs Reviewed  CBC WITH DIFFERENTIAL/PLATELET - Abnormal; Notable for the following components:      Result Value   MCHC 36.1 (*)    Platelets 141 (*)    All other components within normal limits  COMPREHENSIVE METABOLIC PANEL WITH GFR - Abnormal; Notable for the following components:   Potassium 3.2 (*)    Glucose, Bld 114 (*)    All other components within normal limits  D-DIMER, QUANTITATIVE  PRO BRAIN NATRIURETIC PEPTIDE  TROPONIN T, HIGH SENSITIVITY    EKG: EKG Interpretation Date/Time:  Sunday December 24 2023 04:16:45 EDT Ventricular Rate:  89 PR Interval:  155 QRS Duration:  101 QT Interval:  369 QTC Calculation: 449 R Axis:   51  Text Interpretation: Sinus rhythm Abnormal R-wave progression, early transition Borderline T wave abnormalities No significant change was found Confirmed by Carita Senior [phone]) on 12/24/2023 4:19:32 AM  Radiology: CT Head Wo  Contrast Result Date: 12/24/2023 EXAM: CT HEAD WITHOUT CONTRAST 12/24/2023 05:03:45 AM TECHNIQUE: CT of the head was performed without the administration of intravenous contrast. Automated exposure control, iterative reconstruction, and/or weight based adjustment of the mA/kV was utilized to reduce the radiation dose to as low as reasonably achievable. COMPARISON: CT angiogram of the head dated 04/17/2023. CLINICAL HISTORY: Head trauma, moderate-severe. Patient reports he woke up at 3:30 and has palpitations, shortness of breath, and nausea. Reports that he had syncopal episode while sitting off the side  of the bed. Unwitnessed fall but patient reports forehead is hurting and he is having headache so he assumes he hit his head. Recent international travel to the [place]. FINDINGS: BRAIN AND VENTRICLES: No acute hemorrhage. No evidence of acute infarct. No hydrocephalus. No extra-axial collection. No mass effect or midline shift. ORBITS: No acute abnormality. SINUSES: There is a new nondisplaced fracture of the anterior table of the frontal sinus on the right which can be seen on image 25 of series 3. There is no hemosinus present. SOFT TISSUES AND SKULL: There is mild soft tissue swelling of the right forehead. No skull fracture. IMPRESSION: 1. New nondisplaced fracture of the right anterior table of the frontal sinus with mild overlying soft tissue swelling. Electronically signed by: Evalene Coho MD 12/24/2023 05:16 AM EDT RP Workstation: [id number]     Procedures   Medications Ordered in the ED  lactated ringers bolus 1,000 mL (has no administration in time range)  ondansetron  (ZOFRAN ) injection 4 mg (has no administration in time range)                                    Medical Decision Making Amount and/or Complexity of Data Reviewed Labs: ordered. Decision-making details documented in ED Course. Radiology: ordered and independent interpretation performed. Decision-making details documented in  ED Course. ECG/medicine tests: ordered and independent interpretation performed. Decision-making details documented in ED Course.  Risk Prescription drug management. Decision regarding hospitalization.   Palpitations with shortness of breath and episode of loss of consciousness.  No chest pain.  GCS is 15, ABCs are intact.  Vital signs are stable.  EKG is sinus rhythm without Brugada or prolonged QT. Doubt PE without tachycardia or hypoxia but did have recent air travel and COVID last month.   Does have head trauma.  Does not recall feeling lightheaded or dizzy or like he was going to pass out prior to this episode. Orthostatics are negative.  Notably he was evaluated last year for some numbness and had a TIA workup that he was told was normal.  As part of his workup he had an echocardiogram Including TEE June 29, 2023 which showed CONCLUSION: Very small area of color seen across intra-atrial septum, possible very small ASD. 3D images of this taken that show trivial color flow across the septum. No cardiac right to left shunt seen with agitated saline, one late bubble with cough which may represent extracardiac shunt.   Labs with stable hemoglobin.  Mild hypokalemia 3.2 was replaced.  Troponin negative.  D-dimer negative.  BNP not elevated.  CT head does show fracture of frontal sinus.  No hemorrhage. CTPE negative.   Concern for cardiogenic syncope given his lack of prodrome.  versus seizure. No arrhythmias seen in the ED.  He feels well. Still mildly nauseated. Declines pain medication for headache. Low suspicion for ACS. No evidence of PE.   However given recent abnormal echo and syncope with lack of prodrome, will plan observation admission for high risk syncope. He is agreeable. D/w Dr. [place].     Final diagnoses:  Syncope and collapse  Closed fracture of frontal sinus, initial encounter [facility])    ED Discharge Orders     None          Carita Senior, MD 12/24/23  [phone]

## 2024-01-02 ENCOUNTER — Ambulatory Visit: Attending: Cardiovascular Disease | Admitting: Cardiovascular Disease

## 2024-01-02 ENCOUNTER — Encounter: Payer: Self-pay | Admitting: Cardiovascular Disease

## 2024-01-02 VITALS — BP 128/80 | HR 71 | Ht 68.0 in | Wt 160.0 lb

## 2024-01-02 DIAGNOSIS — R55 Syncope and collapse: Secondary | ICD-10-CM

## 2024-01-02 DIAGNOSIS — G459 Transient cerebral ischemic attack, unspecified: Secondary | ICD-10-CM

## 2024-01-02 NOTE — Progress Notes (Signed)
 01/02/2024 George Peterson   10/15/1985  [id number]  Primary Physician George Peterson HERO, MD Primary Cardiologist: George JINNY Lesches MD George Peterson, [place]  HPI:  George Peterson is a 38 y.o.   thin-appearing married Caucasian male with no children who works in IT and was referred by his neurologist, Dr. Ines, for cardiovascular valuation of potential TIA with TEE.  He he is fairly active, exercises on a stationary bike and walks on a daily basis several miles.  He has no other cardiovascular risk factors.  He presented on 03/02/2023 with left-sided numbness to the ER which resolved in 6 to 7 hours.  Workup was unrevealing.  MRI, CT CTA of the head neck showed no abnormalities.  He did see neurology who did transcranial Doppler study which was negative.  He had a event monitor that showed no evidence of A-fib and a transthoracic echo performed 04/19/2023 which was essentially normal except there was a suggestion of intra pulmonary shunting.  A TEE was recommended.  He has had no subsequent episodes.  The neurologist also question the possibility of this being an atypical migraine.  He did have a TEE since I last saw him performed by Dr. Ferd which was essentially normal without a shunt.  He presented to the ER on 12/24/2023 with an episode of syncope that happened in the early a.m. hours.  He had been on minoxidil prior to that for hair loss and stopped it several days previously.  He says he felt like his heart was racing, felt nauseated and diaphoretic.  In the ER he was in sinus rhythm and the workup was unremarkable.  Since discharge she has had 2 similar episodes but not as severe.   Current Meds  Medication Sig   acetaminophen  (TYLENOL ) 500 MG tablet Take 500 mg by mouth every 6 (six) hours as needed for headache.    finasteride (PROPECIA) 1 MG tablet Take 1 mg by mouth daily.   fluticasone (FLONASE) 50 MCG/ACT nasal spray Place 1 spray into both nostrils daily as needed  for allergies or rhinitis.   loratadine (CLARITIN) 10 MG tablet Take 10 mg by mouth daily.   Multiple Vitamin (MULTIVITAMIN WITH MINERALS) TABS tablet Take 1 tablet by mouth daily.   omeprazole  (PRILOSEC) 20 MG capsule Take 1 capsule (20 mg total) by mouth 2 (two) times daily.   SKYRIZI PEN 150 MG/ML pen Inject 150 mg into the skin See admin instructions. Every 12 weeks     No Known Allergies  Social History   Socioeconomic History   Marital status: Married    Spouse name: Not on file   Number of children: 0   Years of education: Not on file   Highest education level: Bachelor's degree (e.g., BA, AB, BS)  Occupational History   Occupation: IT  Tobacco Use   Smoking status: Never   Smokeless tobacco: Never  Vaping Use   Vaping status: Never Used  Substance and Sexual Activity   Alcohol use: Yes    Comment: occ, 2-4 drinks a few times per month   Drug use: No   Sexual activity: Yes  Other Topics Concern   Not on file  Social History Narrative   Work or School: full time Museum/gallery curator      Home Situation: living with wife      Spiritual Beliefs: none      Lifestyle: goes to the gym 6 x per week and running, healthy diet  Social Drivers of Health   Financial Resource Strain: Patient Declined (01/01/2024)   Overall Financial Resource Strain (CARDIA)    Difficulty of Paying Living Expenses: Patient declined  Food Insecurity: Patient Declined (01/01/2024)   Hunger Vital Sign    Worried About Running Out of Food in the Last Year: Patient declined    Ran Out of Food in the Last Year: Patient declined  Transportation Needs: Patient Declined (01/01/2024)   PRAPARE - Administrator, Civil Service (Medical): Patient declined    Lack of Transportation (Non-Medical): Patient declined  Physical Activity: Sufficiently Active (01/01/2024)   Exercise Vital Sign    Days of Exercise per Week: 4 days    Minutes of Exercise per Session: 60 min  Stress:  Patient Declined (01/01/2024)   George Peterson [employer] - Occupational Stress Questionnaire    Feeling of Stress: Patient declined  Social Connections: Unknown (01/01/2024)   Social Connection and Isolation Panel    Frequency of Communication with Friends and Family: Patient declined    Frequency of Social Gatherings with Friends and Family: Patient declined    Attends Religious Services: Patient declined    Database administrator or Organizations: Patient declined    Attends Engineer, structural: Not on file    Marital Status: Patient declined  Intimate Partner Violence: Not on file     Review of Systems: General: negative for chills, fever, night sweats or weight changes.  Cardiovascular: negative for chest pain, dyspnea on exertion, edema, orthopnea, palpitations, paroxysmal nocturnal dyspnea or shortness of breath Dermatological: negative for rash Respiratory: negative for cough or wheezing Urologic: negative for hematuria Abdominal: negative for nausea, vomiting, diarrhea, bright red blood per rectum, melena, or hematemesis Neurologic: negative for visual changes, syncope, or dizziness All other systems reviewed and are otherwise negative except as noted above.    Blood pressure 128/80, pulse 71, height 5' 8 (1.727 m), weight 160 lb (72.6 kg), SpO2 99%.  General appearance: alert and no distress Neck: no adenopathy, no carotid bruit, no JVD, supple, symmetrical, trachea midline, and thyroid  not enlarged, symmetric, no tenderness/mass/nodules Lungs: clear to auscultation bilaterally Heart: regular rate and rhythm, S1, S2 normal, no murmur, click, rub or gallop Extremities: extremities normal, atraumatic, no cyanosis or edema Pulses: 2+ and symmetric Skin: Skin color, texture, turgor normal. No rashes or lesions Neurologic: Grossly normal  EKG not performed today      ASSESSMENT AND PLAN:   Syncope and collapse Mr. George Peterson was seen in the ER by  George Peterson on 12/24/2023 because of syncope.  He apparently had been on minoxidil for hair loss for several weeks prior to his presentation.  He stopped the minoxidil several days before.  He woke up at 3:00 in the morning to go to the bathroom.  He felt tachypalpitations, nausea and diaphoresis and then lost consciousness and fell to the ground.  His wife got up from sleep and they went to the ER.  His workup was unrevealing.  He has had a TEE forked/10/25 by Dr. Lonni that was essentially normal.  There was no shunt from intents and purposes with normal LV function.  This was done in the workup of a TIA.  He also wore a monitor that showed no arrhythmias.  I have instructed him that he cannot drive for 6 months given his recent syncopal episode without a definitive diagnosis.  I have also suggested that he have a loop recorder implanted to determine whether or not  these episodes are arrhythmogenic.     George DOROTHA Lesches MD FACP,FACC,FAHA, [facility] 01/02/2024 9:22 AM

## 2024-01-02 NOTE — Assessment & Plan Note (Signed)
 George Peterson was seen in the ER by Dr. Lea on 12/24/2023 because of syncope.  He apparently had been on minoxidil for hair loss for several weeks prior to his presentation.  He stopped the minoxidil several days before.  He woke up at 3:00 in the morning to go to the bathroom.  He felt tachypalpitations, nausea and diaphoresis and then lost consciousness and fell to the ground.  His wife got up from sleep and they went to the ER.  His workup was unrevealing.  He has had a TEE forked/10/25 by Dr. Lonni that was essentially normal.  There was no shunt from intents and purposes with normal LV function.  This was done in the workup of a TIA.  He also wore a monitor that showed no arrhythmias.  I have instructed him that he cannot drive for 6 months given his recent syncopal episode without a definitive diagnosis.  I have also suggested that he have a loop recorder implanted to determine whether or not these episodes are arrhythmogenic.

## 2024-01-02 NOTE — Patient Instructions (Signed)
 Medication Instructions:  Your physician recommends that you continue on your current medications as directed. Please refer to the Current Medication list given to you today.  *If you need a refill on your cardiac medications before your next appointment, please call your pharmacy*  Follow-Up: At [facility]: [facility], you and your health needs are our priority.  As part of our continuing mission to provide you with exceptional heart care, our providers are all part of one team.  This team includes your primary Cardiologist (physician) and Advanced Practice Providers or APPs (Physician Assistants and Nurse Practitioners) who all work together to provide you with the care you need, when you need it.  Your next appointment:   6 month(s)  Provider:   Callie Goodrich, PA-C, Kathleen Johnson, PA-C, Hao Meng, PA-C, Damien Braver, NP, or Katlyn West, NP        Then, Dorn Lesches, MD will plan to see you again in 12 month(s).

## 2024-01-04 NOTE — Progress Notes (Signed)
 Electrophysiology Office Note:   Date:  01/04/2024  ID:  George Peterson, DOB 04/20/1985, MRN [id number]  Primary Cardiologist: Dorn Lesches, MD Primary Heart Failure: None Electrophysiologist: None      History of Present Illness:   George Peterson is a 38 y.o. male with h/o possible TIA, syncope seen today for  for Electrophysiology evaluation of TIA, syncope at the request of Dorn Lesches.    He presented 03/02/2023 with left-sided numbness to the emergency room.  It resolved after 6 to 7 hours.  Workup was unrevealing.  MRI, CTA of the head and neck showed no abnormalities.  Neurology did a Krahn strain with Doppler which was also negative.  Cardiac monitor showed no evidence of atrial fibrillation.  Echo showed no major abnormalities.  TEE was performed without shunt.  He presented to the hospital 12/24/2023 with an episode of syncope that had occurred in the early morning hours.  He folic his heart was racing at the time.  He felt nauseous and diaphoretic.  Since discharge from the emergency room, he has had other episodes of near syncope but not as severe.  Discussed the use of AI scribe software for clinical note transcription with the patient, who gave verbal consent to proceed.  History of Present Illness   George Peterson is a 38 year old male who presents with episodes of nocturnal palpitations and cold sweats.  He experiences these episodes around 3:30 AM, waking with a sensation of his heart racing, feeling cold, and having cold sweats. Initially, he adjusted the air conditioning, but symptoms persisted, leading to nausea and loss of consciousness, resulting in an ER visit with normal evaluations. A similar episode occurred a few days later, characterized by a racing heart without pain or shortness of breath. He has only had one episode of syncope. He notes a potential correlation with sleeping on his right side during these episodes.  During the day, he occasionally feels his  heart beating heavily, described as 'beating through syrup,' without tightness, pressure, or shortness of breath. These sensations do not interfere with his activities. Since the initial ER visit, symptoms have become more frequent, occurring a couple of times a week. No chest pain, shortness of breath, dizziness, or exertional limitations are reported. He maintains regular physical activity, including walking and using a stationary bike, without issues.       Review of systems complete and found to be negative unless listed in HPI.   EP Information / Studies Reviewed:    EKG is not ordered today. EKG from 12/24/2023 reviewed which showed sinus rhythm,        Risk Assessment/Calculations:            Physical Exam:   VS:  There were no vitals taken for this visit.   Wt Readings from Last 3 Encounters:  01/02/24 160 lb (72.6 kg)  12/24/23 165 lb (74.8 kg)  09/29/23 164 lb (74.4 kg)     GEN: Well nourished, well developed in no acute distress NECK: No JVD; No carotid bruits CARDIAC: Regular rate and rhythm, no murmurs, rubs, gallops RESPIRATORY:  Clear to auscultation without rales, wheezing or rhonchi  ABDOMEN: Soft, non-tender, non-distended EXTREMITIES:  No edema; No deformity   ASSESSMENT AND PLAN:    1.  Syncope: He was on minoxidil for several weeks prior to presentation.  He stopped the medication several days before his episode of syncope.  He woke up at 3 AM to go to the bathroom.  He felt palpitations,  nausea, diaphoresis and lost consciousness.  He has had a workup which has been unrevealing.  He would benefit from further cardiac monitoring.,  We Tasneem Cormier plan for 2-week monitor.  If the monitor is unrevealing and he does not have symptoms, we Jolleen Seman likely plan for ILR implant.  Follow up with EP Team pending monitor   Signed, Secret Kristensen Gladis Norton, MD

## 2024-01-05 ENCOUNTER — Ambulatory Visit

## 2024-01-05 ENCOUNTER — Encounter: Payer: Self-pay | Admitting: Family Medicine

## 2024-01-05 ENCOUNTER — Ambulatory Visit: Admitting: Family Medicine

## 2024-01-05 ENCOUNTER — Encounter: Payer: Self-pay | Admitting: Cardiology

## 2024-01-05 ENCOUNTER — Telehealth: Payer: Self-pay | Admitting: Family Medicine

## 2024-01-05 ENCOUNTER — Ambulatory Visit: Attending: Cardiology | Admitting: Cardiology

## 2024-01-05 VITALS — BP 120/80 | HR 65 | Temp 98.3°F | Ht 68.5 in | Wt 161.6 lb

## 2024-01-05 VITALS — BP 129/92 | HR 85 | Ht 68.0 in | Wt 163.0 lb

## 2024-01-05 DIAGNOSIS — Z23 Encounter for immunization: Secondary | ICD-10-CM | POA: Diagnosis not present

## 2024-01-05 DIAGNOSIS — Z13220 Encounter for screening for lipoid disorders: Secondary | ICD-10-CM

## 2024-01-05 DIAGNOSIS — R55 Syncope and collapse: Secondary | ICD-10-CM

## 2024-01-05 DIAGNOSIS — Z0000 Encounter for general adult medical examination without abnormal findings: Secondary | ICD-10-CM | POA: Diagnosis not present

## 2024-01-05 DIAGNOSIS — Z789 Other specified health status: Secondary | ICD-10-CM

## 2024-01-05 NOTE — Progress Notes (Signed)
 Complete physical exam  Patient: George Peterson   DOB: March 25, 1985   38 y.o. Male  MRN: [id number]  Subjective:    Chief Complaint  Patient presents with   Annual Exam    George Peterson is a 38 y.o. male who presents today for a complete physical exam. He reports consuming a vegetarian diet. Getting fruits and veggies daily, eats beans and eggs regularly for his main sources of protein. Does eat some cheese, greek yogurt, meat alternatives, etc. Rarely eats ultraprocessed foods, occasionally chips and snacks. Home exercise routine includes walking 2 -3 hrs per week. Also has a stationary bike that he usually uses at home. He generally feels well. He reports sleeping well. He does not have additional problems to discuss today.    Most recent fall risk assessment:     No data to display           Most recent depression screenings:    01/05/2024    3:31 PM 02/09/2023    5:16 PM  PHQ 2/9 Scores  PHQ - 2 Score 0 1  PHQ- 9 Score 1 3    Vision:Within last year and wears glasses and contacts and Dental: No current dental problems and Receives regular dental care  Patient Active Problem List   Diagnosis Date Noted   Syncope and collapse 12/24/2023   ASD (atrial septal defect) 06/29/2023   TIA (transient ischemic attack) 06/28/2023   Psoriasis 09/07/2018   Nephrolithiasis 09/07/2018   Exposure to COVID-19 virus 09/07/2018      Patient Care Team: Ozell Heron HERO, MD as PCP - General (Family Medicine) Court Dorn PARAS, MD as PCP - Cardiology (Cardiology) Ivin Kocher, MD as Referring Physician (Dermatology)   Outpatient Medications Prior to Visit  Medication Sig   acetaminophen  (TYLENOL ) 500 MG tablet Take 500 mg by mouth every 6 (six) hours as needed for headache.    finasteride (PROPECIA) 1 MG tablet Take 1 mg by mouth daily.   fluticasone (FLONASE) 50 MCG/ACT nasal spray Place 1 spray into both nostrils daily as needed for allergies or rhinitis.   loratadine  (CLARITIN) 10 MG tablet Take 10 mg by mouth daily.   Multiple Vitamin (MULTIVITAMIN WITH MINERALS) TABS tablet Take 1 tablet by mouth daily.   omeprazole  (PRILOSEC) 20 MG capsule Take 1 capsule (20 mg total) by mouth 2 (two) times daily.   SKYRIZI PEN 150 MG/ML pen Inject 150 mg into the skin See admin instructions. Every 12 weeks   No facility-administered medications prior to visit.    Review of Systems  HENT:  Negative for hearing loss.   Eyes:  Negative for blurred vision.  Respiratory:  Negative for shortness of breath.   Cardiovascular:  Negative for chest pain.  Gastrointestinal: Negative.   Genitourinary: Negative.   Musculoskeletal:  Negative for back pain.  Neurological:  Negative for headaches.  Psychiatric/Behavioral:  Negative for depression.        Objective:     BP 120/80   Pulse 65   Temp 98.3 F (36.8 C) (Oral)   Ht 5' 8.5 (1.74 m)   Wt 161 lb 9.6 oz (73.3 kg)   SpO2 99%   BMI 24.21 kg/m    Physical Exam Vitals reviewed.  Constitutional:      Appearance: Normal appearance. He is well-groomed and normal weight.  HENT:     Right Ear: Tympanic membrane and ear canal normal.     Left Ear: Tympanic membrane and ear canal normal.  Mouth/Throat:     Mouth: Mucous membranes are moist.     Pharynx: No posterior oropharyngeal erythema.  Eyes:     Extraocular Movements: Extraocular movements intact.     Conjunctiva/sclera: Conjunctivae normal.  Neck:     Thyroid : No thyromegaly.  Cardiovascular:     Rate and Rhythm: Normal rate and regular rhythm.     Heart sounds: S1 normal and S2 normal. No murmur heard. Pulmonary:     Effort: Pulmonary effort is normal.     Breath sounds: Normal breath sounds and air entry. No rales.  Abdominal:     General: Abdomen is flat. Bowel sounds are normal.  Musculoskeletal:     Right lower leg: No edema.     Left lower leg: No edema.  Lymphadenopathy:     Cervical: No cervical adenopathy.  Neurological:      General: No focal deficit present.     Mental Status: He is alert and oriented to person, place, and time.     Gait: Gait is intact.  Psychiatric:        Mood and Affect: Mood and affect normal.      No results found for any visits on 01/05/24.     Assessment & Plan:    Routine Health Maintenance and Physical Exam  Immunization History  Administered Date(s) Administered   Influenza, Seasonal, Injecte, Preservative Fre 01/05/2024   Influenza,inj,Quad PF,6+ Mos 11/09/2018   Influenza-Unspecified 01/08/2018, 12/19/2020   Moderna Covid Bivalent Peds Booster(63mo Thru 29yrs) 12/19/2020   Moderna Sars-Covid-2 Vaccination 05/22/2019, 06/19/2019, 12/20/2019   Tdap 11/09/2018    Health Maintenance  Topic Date Due   Pneumococcal Vaccine (1 of 2 - PCV) Never done   Hepatitis B Vaccines 19-59 Average Risk (1 of 3 - 19+ 3-dose series) Never done   HPV VACCINES (1 - 3-dose SCDM series) Never done   COVID-19 Vaccine (5 - 2025-26 season) 11/20/2023   DTaP/Tdap/Td (2 - Td or Tdap) 11/08/2028   Influenza Vaccine  Completed   Hepatitis C Screening  Completed   HIV Screening  Completed   Meningococcal B Vaccine  Aged Out    Discussed health benefits of physical activity, and encouraged him to engage in regular exercise appropriate for his age and condition.  Routine adult health maintenance  Immunization due -     Flu vaccine trivalent PF, 6mos and older(Flulaval,Afluria,Fluarix,Fluzone)  Syncope and collapse -     Comprehensive metabolic panel with GFR; Future -     TSH; Future  Lipid screening -     Lipid panel; Future  Vegetarian diet -     Vitamin B12; Future -     VITAMIN D 25 Hydroxy (Vit-D Deficiency, Fractures); Future  General physical exam findings are normal today. I reviewed the patient's preventative testing, immunizations, and lifestyle habits. I made appropriate recommendations and placed orders for the appropriate tests and/or vaccinations. I counseled the patient on  the CDC's recommendations for healthy exercise and diet. I counseled the patient on healthy sleep habits and stress management. Handouts to reinforce the counseling were given at the conclusion of the visit.    Return in about 1 year (around 01/04/2025) for annual physical exam.     Heron CHRISTELLA Sharper, MD

## 2024-01-05 NOTE — Patient Instructions (Signed)
 Medication Instructions:  Your physician recommends that you continue on your current medications as directed. Please refer to the Current Medication list given to you today.  *If you need a refill on your cardiac medications before your next appointment, please call your pharmacy*  Testing/Procedures: Event Monitor  Your physician has recommended that you wear an event monitor. Event monitors are medical devices that record the heart's electrical activity. Doctors most often us  these monitors to diagnose arrhythmias. Arrhythmias are problems with the speed or rhythm of the heartbeat. The monitor is a small, portable device. You can wear one while you do your normal daily activities. This is usually used to diagnose what is causing palpitations/syncope (passing out).  Follow-Up: At [facility], you and your health needs are our priority.  As part of our continuing mission to provide you with exceptional heart care, our providers are all part of one team.  This team includes your primary Cardiologist (physician) and Advanced Practice Providers or APPs (Physician Assistants and Nurse Practitioners) who all work together to provide you with the care you need, when you need it.  Your next appointment:   Based on result of your monitor    ZIO XT- Long Term Monitor Instructions  Your physician has requested you wear a ZIO patch monitor for 14 days.  This is a single patch monitor. [employer] supplies one patch monitor per enrollment. Additional stickers are not available. Please do not apply patch if you will be having a Nuclear Stress Test,  Echocardiogram, Cardiac CT, MRI, or Chest Xray during the period you would be wearing the  monitor. The patch cannot be worn during these tests. You cannot remove and re-apply the  ZIO XT patch monitor.  Your ZIO patch monitor will be mailed 3 day USPS to your address on file. It may take 3-5 days  to receive your monitor after you have been enrolled.   Once you have received your monitor, please review the enclosed instructions. Your monitor  has already been registered assigning a specific monitor serial # to you.  Billing and Patient Assistance Program Information  We have supplied [employer] with any of your insurance information on file for billing purposes. [employer] offers a sliding scale Patient Assistance Program for patients that do not have  insurance, or whose insurance does not completely cover the cost of the ZIO monitor.  You must apply for the Patient Assistance Program to qualify for this discounted rate.  To apply, please call [employer] at [phone], select option 4, select option 2, ask to apply for  Patient Assistance Program. Meredeth will ask your household income, and how many people  are in your household. They will quote your out-of-pocket cost based on that information.  [employer] will also be able to set up a 85-month, interest-free payment plan if needed.  Applying the monitor   Shave hair from upper left chest.  Hold abrader disc by orange tab. Rub abrader in 40 strokes over the upper left chest as  indicated in your monitor instructions.  Clean area with 4 enclosed alcohol pads. Let dry.  Apply patch as indicated in monitor instructions. Patch will be placed under collarbone on left  side of chest with arrow pointing upward.  Rub patch adhesive wings for 2 minutes. Remove white label marked 1. Remove the white  label marked 2. Rub patch adhesive wings for 2 additional minutes.  While looking in a mirror, press and release button in center of patch. A small green  light will  flash 3-4 times. This will be your only indicator that the monitor has been turned on.  Do not shower for the first 24 hours. You may shower after the first 24 hours.  Press the button if you feel a symptom. You will hear a small click. Record Date, Time and  Symptom in the Patient Logbook.  When you are ready to remove the patch, follow  instructions on the last 2 pages of Patient  Logbook. Stick patch monitor onto the last page of Patient Logbook.  Place Patient Logbook in the blue and white box. Use locking tab on box and tape box closed  securely. The blue and white box has prepaid postage on it. Please place it in the mailbox as  soon as possible. Your physician should have your test results approximately 7 days after the  monitor has been mailed back to [facility].  Call [facility] Customer Care at [phone] if you have questions regarding  your ZIO XT patch monitor. Call them immediately if you see an orange light blinking on your  monitor.  If your monitor falls off in less than 4 days, contact our Monitor department at [phone].  If your monitor becomes loose or falls off after 4 days call [employer] at [phone] for  suggestions on securing your monitor

## 2024-01-05 NOTE — Telephone Encounter (Signed)
 Pt requesting a call to discuss an ENT referral that was discussed during his appointment today

## 2024-01-05 NOTE — Progress Notes (Unsigned)
 Enrolled patient for a 14 day Zio XT  monitor to be mailed to patients home

## 2024-01-08 NOTE — Telephone Encounter (Signed)
 Spoke with the patient for more information.  Patient stated during the physical he mentioned to Dr Ozell he has noticed a mildew smell to his breath also mouth breathing at night and she was going to place a referral to an ENT.  Patient stated after discussing a few other things, this may have ben missed.  Message sent to PCP.

## 2024-01-09 ENCOUNTER — Encounter: Payer: Self-pay | Admitting: Gastroenterology

## 2024-01-09 ENCOUNTER — Ambulatory Visit: Admitting: Gastroenterology

## 2024-01-09 VITALS — BP 126/70 | HR 68 | Ht 68.0 in | Wt 163.1 lb

## 2024-01-09 DIAGNOSIS — R110 Nausea: Secondary | ICD-10-CM

## 2024-01-09 DIAGNOSIS — K828 Other specified diseases of gallbladder: Secondary | ICD-10-CM

## 2024-01-09 DIAGNOSIS — R1310 Dysphagia, unspecified: Secondary | ICD-10-CM

## 2024-01-09 DIAGNOSIS — K200 Eosinophilic esophagitis: Secondary | ICD-10-CM

## 2024-01-09 DIAGNOSIS — R55 Syncope and collapse: Secondary | ICD-10-CM

## 2024-01-09 NOTE — Progress Notes (Signed)
 Chief Complaint: dysphagia Primary GI MD: Dr. Stacia  HPI: Discussed the use of AI scribe software for clinical note transcription with the patient, who gave verbal consent to proceed.  38 year old male with eosinophilic esophagitis and gallbladder dysfunction who presents with ongoing gastrointestinal and cardiac symptoms.  He experiences ongoing difficulty swallowing and nausea, both with and without eating. Despite taking omeprazole  20 mg twice daily, his symptoms have not significantly improved. He frequently burps, even without consuming carbonated beverages. He has undergone two endoscopies, with the second showing some improvement in eosinophil levels. Attempts to reduce the omeprazole  dose were unsuccessful due to persistent symptoms. He compensates for swallowing difficulties by drinking large amounts of water during meals, especially with larger meals like pasta or rice.  He was told in the emergency department that he had experienced a transient ischemic attack (TIA). He describes episodes of 'chest weirdness,' characterized by a sensation of feeling his heart beating without associated shortness of breath or pressure. He had an episode of cold sweats and racing heart at night, leading to loss of consciousness, which he attributes to possibly standing up too quickly. He had stopped taking oral minoxidil a few days prior to the ER visit. He is awaiting a heart monitor to further investigate these symptoms.  He has a history of gallbladder dysfunction, as indicated by a borderline HIDA scan. He experiences nausea and occasional urgent bowel movements.    PREVIOUS GI WORKUP     Past Medical History:  Diagnosis Date   Allergy    Kidney stones    multiple stones passed as child, had lithotripsy once   Psoriasis     Past Surgical History:  Procedure Laterality Date   EXTRACORPOREAL SHOCK WAVE LITHOTRIPSY Left 12/12/2019   Procedure: LEFT EXTRACORPOREAL SHOCK WAVE  LITHOTRIPSY (ESWL);  Surgeon: Rosalind Zachary NOVAK, MD;  Location: [facility];  Service: Urology;  Laterality: Left;   LITHOTRIPSY  2007   lymph node biospy     with splenic infection   PILONIDAL CYST EXCISION  2008   TRANSESOPHAGEAL ECHOCARDIOGRAM (CATH LAB) N/A 06/29/2023   Procedure: TRANSESOPHAGEAL ECHOCARDIOGRAM;  Surgeon: Lonni Slain, MD;  Location: [facility] INVASIVE CV LAB;  Service: Cardiovascular;  Laterality: N/A;   WISDOM TOOTH EXTRACTION      Current Outpatient Medications  Medication Sig Dispense Refill   acetaminophen  (TYLENOL ) 500 MG tablet Take 500 mg by mouth every 6 (six) hours as needed for headache.      finasteride (PROPECIA) 1 MG tablet Take 1 mg by mouth daily.     fluticasone (FLONASE) 50 MCG/ACT nasal spray Place 1 spray into both nostrils daily as needed for allergies or rhinitis.     loratadine (CLARITIN) 10 MG tablet Take 10 mg by mouth daily.     Multiple Vitamin (MULTIVITAMIN WITH MINERALS) TABS tablet Take 1 tablet by mouth daily.     omeprazole  (PRILOSEC) 20 MG capsule Take 1 capsule (20 mg total) by mouth 2 (two) times daily. 180 capsule 3   SKYRIZI PEN 150 MG/ML pen Inject 150 mg into the skin See admin instructions. Every 12 weeks     No current facility-administered medications for this visit.    Allergies as of 01/09/2024   (No Known Allergies)    Family History  Problem Relation Age of Onset   Hypertension Mother    Hyperlipidemia Mother    Colon polyps Mother    Skin cancer Mother    Arthritis Father    Kidney Stones Father  ALS Father    AAA (abdominal aortic aneurysm) Maternal Grandfather    Heart disease Paternal Grandfather    Colon cancer Neg Hx    Esophageal cancer Neg Hx    Rectal cancer Neg Hx    Stomach cancer Neg Hx     Social History   Socioeconomic History   Marital status: Married    Spouse name: Not on file   Number of children: 0   Years of education: Not on file   Highest education level:  Bachelor's degree (e.g., BA, AB, BS)  Occupational History   Occupation: IT  Tobacco Use   Smoking status: Never   Smokeless tobacco: Never  Vaping Use   Vaping status: Never Used  Substance and Sexual Activity   Alcohol use: Yes    Comment: occ, 2-4 drinks a few times per month   Drug use: No   Sexual activity: Yes  Other Topics Concern   Not on file  Social History Narrative   Work or School: full time Museum/gallery curator      Home Situation: living with wife      Spiritual Beliefs: none      Lifestyle: goes to the gym 6 x per week and running, healthy diet            Social Drivers of Health   Financial Resource Strain: Patient Declined (01/01/2024)   Overall Financial Resource Strain (CARDIA)    Difficulty of Paying Living Expenses: Patient declined  Food Insecurity: Patient Declined (01/01/2024)   Hunger Vital Sign    Worried About Running Out of Food in the Last Year: Patient declined    Ran Out of Food in the Last Year: Patient declined  Transportation Needs: Patient Declined (01/01/2024)   PRAPARE - Administrator, Civil Service (Medical): Patient declined    Lack of Transportation (Non-Medical): Patient declined  Physical Activity: Sufficiently Active (01/01/2024)   Exercise Vital Sign    Days of Exercise per Week: 4 days    Minutes of Exercise per Session: 60 min  Stress: Patient Declined (01/01/2024)   [employer] of Occupational Health - Occupational Stress Questionnaire    Feeling of Stress: Patient declined  Social Connections: Unknown (01/01/2024)   Social Connection and Isolation Panel    Frequency of Communication with Friends and Family: Patient declined    Frequency of Social Gatherings with Friends and Family: Patient declined    Attends Religious Services: Patient declined    Database administrator or Organizations: Patient declined    Attends Engineer, structural: Not on file    Marital Status: Patient declined  Careers information officer Violence: Not on file    Review of Systems:    Constitutional: No weight loss, fever, chills, weakness or fatigue HEENT: Eyes: No change in vision               Ears, Nose, Throat:  No change in hearing or congestion Skin: No rash or itching Cardiovascular: No chest pain, chest pressure or palpitations   Respiratory: No SOB or cough Gastrointestinal: See HPI and otherwise negative Genitourinary: No dysuria or change in urinary frequency Neurological: No headache, dizziness or syncope Musculoskeletal: No new muscle or joint pain Hematologic: No bleeding or bruising Psychiatric: No history of depression or anxiety    Physical Exam:  Vital signs: BP 126/70 (BP Location: Left Arm, Patient Position: Sitting, Cuff Size: Normal)   Pulse 68   Ht 5' 8 (1.727 m)   Wt  163 lb 2 oz (74 kg)   BMI 24.80 kg/m   Constitutional: NAD, alert and cooperative Head:  Normocephalic and atraumatic. Eyes:   PEERL, EOMI. No icterus. Conjunctiva pink. Respiratory: Respirations even and unlabored. Lungs clear to auscultation bilaterally.   No wheezes, crackles, or rhonchi.  Cardiovascular:  Regular rate and rhythm. No peripheral edema, cyanosis or pallor.  Gastrointestinal:  Soft, nondistended, nontender. No rebound or guarding. Normal bowel sounds. No appreciable masses or hepatomegaly. Rectal:  Declines Msk:  Symmetrical without gross deformities. Without edema, no deformity or joint abnormality.  Neurologic:  Alert and  oriented x4;  grossly normal neurologically.  Skin:   Dry and intact without significant lesions or rashes. Psychiatric: Oriented to person, place and time. Demonstrates good judgement and reason without abnormal affect or behaviors.  Physical Exam    RELEVANT LABS AND IMAGING: CBC    Component Value Date/Time   WBC 6.5 12/24/2023 0415   RBC 4.80 12/24/2023 0415   HGB 14.2 12/24/2023 0415   HGB 14.3 06/28/2023 1010   HCT 39.3 12/24/2023 0415   HCT 40.9 06/28/2023  1010   PLT 141 (L) 12/24/2023 0415   PLT 178 06/28/2023 1010   MCV 81.9 12/24/2023 0415   MCV 87 06/28/2023 1010   MCH 29.6 12/24/2023 0415   MCHC 36.1 (H) 12/24/2023 0415   RDW 11.9 12/24/2023 0415   RDW 11.9 06/28/2023 1010   LYMPHSABS 2.5 12/24/2023 0415   MONOABS 0.4 12/24/2023 0415   EOSABS 0.1 12/24/2023 0415   BASOSABS 0.0 12/24/2023 0415    CMP     Component Value Date/Time   NA 140 12/24/2023 0415   NA 141 06/28/2023 1010   K 3.2 (L) 12/24/2023 0415   CL 102 12/24/2023 0415   CO2 27 12/24/2023 0415   GLUCOSE 114 (H) 12/24/2023 0415   BUN 13 12/24/2023 0415   BUN 8 06/28/2023 1010   CREATININE 0.89 12/24/2023 0415   CALCIUM 10.3 12/24/2023 0415   PROT 7.3 12/24/2023 0415   ALBUMIN 4.7 12/24/2023 0415   AST 26 12/24/2023 0415   ALT 28 12/24/2023 0415   ALKPHOS 65 12/24/2023 0415   BILITOT 0.8 12/24/2023 0415   GFRNONAA >60 12/24/2023 0415   GFRAA >60 12/12/2019 1914     Assessment/Plan:    Dysphagia Dysphagia to solids but improved on initial induction of omeprazole  with recurrence.  Resolves with drinking water with meals.  EGD April 2025 with eosinophilic esophagitis we repeat EGD July 2025 showing improvement in eosinophils though patient continues to be symptomatic despite omeprazole  use - Will try to wean off of omeprazole  and see if this makes a difference in his symptoms.  If it does but is the likely culprit. - Consider budesonide slurry, will discuss this with Dr. Stacia - Consider esophageal manometry for further evaluation of persistent dysphagia despite improvement shown on endoscopy and medical management   Symptomatic Biliary Dyskinesia Continued nausea and discomfort with eating with recent EGD showing improvement in esophagitis.  Gallbladder ejection fraction at 34%, indicating suboptimal function. Patient would ike referral - Refer to [facility]  Syncope and collapse Seen in ED 12/2023 after syncope and collapse weeks after changing her blood  pressure medicine with extensive negative workup currently undergoing heart monitor with cardiology.  Possible vasovagal response versus blood pressure versus heart arrhythmia. - Continue follow-up with cardiology  Nestor Mollie RIGGERS [facility] 01/09/2024, 3:53 PM  Cc: Ozell Heron HERO, MD

## 2024-01-09 NOTE — Patient Instructions (Addendum)
 Esophageal Manometry referral sent to :                 [facility]  [address], [place], [place] [address] Phone: [phone]  _______________________________________________________  If your blood pressure at your visit was 140/90 or greater, please contact your primary care physician to follow up on this.  _______________________________________________________  If you are age 37 or older, your body mass index should be between 23-30. Your Body mass index is 24.8 kg/m. If this is out of the aforementioned range listed, please consider follow up with your Primary Care Provider.  If you are age 56 or younger, your body mass index should be between 19-25. Your Body mass index is 24.8 kg/m. If this is out of the aformentioned range listed, please consider follow up with your Primary Care Provider.   ________________________________________________________  The [place] [facility] providers would like to encourage you to use MYCHART to communicate with providers for non-urgent requests or questions.  Due to long hold times on the telephone, sending your provider a message by [facility] may be a faster and more efficient way to get a response.  Please allow 48 business hours for a response.  Please remember that this is for non-urgent requests.  _______________________________________________________  [facility] is using a team-based approach to care.  Your team is made up of your doctor and two to three APPS. Our APPS (Nurse Practitioners and Physician Assistants) work with your physician to ensure care continuity for you. They are fully qualified to address your health concerns and develop a treatment plan. They communicate directly with your gastroenterologist to care for you. Seeing the Advanced Practice Practitioners on your physician's team can help you by facilitating care more promptly, often allowing for earlier appointments, access to diagnostic testing,  procedures, and other specialty referrals.

## 2024-01-10 ENCOUNTER — Other Ambulatory Visit

## 2024-01-10 ENCOUNTER — Ambulatory Visit: Payer: Self-pay | Admitting: Family Medicine

## 2024-01-10 DIAGNOSIS — Z13220 Encounter for screening for lipoid disorders: Secondary | ICD-10-CM

## 2024-01-10 DIAGNOSIS — R55 Syncope and collapse: Secondary | ICD-10-CM

## 2024-01-10 DIAGNOSIS — Z789 Other specified health status: Secondary | ICD-10-CM | POA: Diagnosis not present

## 2024-01-10 LAB — COMPREHENSIVE METABOLIC PANEL WITH GFR
ALT: 19 U/L (ref 0–53)
AST: 20 U/L (ref 0–37)
Albumin: 4.8 g/dL (ref 3.5–5.2)
Alkaline Phosphatase: 44 U/L (ref 39–117)
BUN: 9 mg/dL (ref 6–23)
CO2: 29 meq/L (ref 19–32)
Calcium: 10 mg/dL (ref 8.4–10.5)
Chloride: 103 meq/L (ref 96–112)
Creatinine, Ser: 0.9 mg/dL (ref 0.40–1.50)
GFR: 108.92 mL/min (ref >60.00)
Glucose, Bld: 95 mg/dL (ref 70–99)
Potassium: 3.8 meq/L (ref 3.5–5.1)
Sodium: 140 meq/L (ref 135–145)
Total Bilirubin: 0.9 mg/dL (ref 0.2–1.2)
Total Protein: 7.3 g/dL (ref 6.0–8.3)

## 2024-01-10 LAB — LIPID PANEL
Cholesterol: 122 mg/dL (ref 0–200)
HDL: 43.6 mg/dL (ref >39.00)
LDL Cholesterol: 66 mg/dL (ref 0–99)
NonHDL: 78.78
Total CHOL/HDL Ratio: 3
Triglycerides: 62 mg/dL (ref 0.0–149.0)
VLDL: 12.4 mg/dL (ref 0.0–40.0)

## 2024-01-10 LAB — VITAMIN D 25 HYDROXY (VIT D DEFICIENCY, FRACTURES): VITD: 30.82 ng/mL (ref 30.00–100.00)

## 2024-01-10 LAB — VITAMIN B12: Vitamin B-12: 788 pg/mL (ref 211–911)

## 2024-01-10 LAB — TSH: TSH: 0.83 u[IU]/mL (ref 0.35–5.50)

## 2024-01-10 NOTE — Telephone Encounter (Signed)
 Patient informed of the message below.

## 2024-01-10 NOTE — Telephone Encounter (Signed)
 We discussed it but it wasn't decided on to send him -- he really didn't have any other sinus issues or other reasons to send him. Changes in breath can be from multiple things so unless he had other symptoms I wouldn't recommend sending him at this time. If he develops any new symptoms then we can always re-evaluate.

## 2024-01-14 NOTE — Progress Notes (Signed)
 Agree with the assessment and plan as outlined by Nestor Blower, PA-C.  Patient had some elevated eosinophils on initial biopsies, but none on subsequent EGD, despite the fact he continued to have dysphagia.  Agree with considering a functional assessment for his dysphagia with either a barium esophagram or esophageal manometry.  Don't think that budesonide would be helpful given that the eosinophilia had resolved with omeprazole .

## 2024-01-16 ENCOUNTER — Telehealth: Payer: Self-pay

## 2024-01-16 ENCOUNTER — Other Ambulatory Visit: Payer: Self-pay

## 2024-01-16 DIAGNOSIS — R1310 Dysphagia, unspecified: Secondary | ICD-10-CM

## 2024-01-16 NOTE — Telephone Encounter (Signed)
-----   Message from Nestor CHRISTELLA Blower sent at 01/15/2024 11:05 AM EDT ----- Can set up for barium swallow with tab with dx code dysphagia since esophageal manometry is so far away ----- Message ----- From: Stacia Glendia BRAVO, MD Sent: 01/14/2024  11:42 AM EDT To: Nestor CHRISTELLA Blower, PA-C

## 2024-01-22 NOTE — Telephone Encounter (Signed)
 Inbound call from patient in regards to [facility] referral. Please advise.

## 2024-01-30 ENCOUNTER — Ambulatory Visit (HOSPITAL_COMMUNITY)
Admission: RE | Admit: 2024-01-30 | Discharge: 2024-01-30 | Disposition: A | Discharge status: Home / Self Care | Source: Ambulatory Visit | Attending: Gastroenterology | Admitting: Gastroenterology

## 2024-01-30 ENCOUNTER — Ambulatory Visit: Payer: Self-pay | Admitting: Gastroenterology

## 2024-01-30 ENCOUNTER — Other Ambulatory Visit: Payer: Self-pay | Admitting: Gastroenterology

## 2024-01-30 DIAGNOSIS — R1310 Dysphagia, unspecified: Secondary | ICD-10-CM | POA: Insufficient documentation

## 2024-02-06 DIAGNOSIS — R55 Syncope and collapse: Secondary | ICD-10-CM

## 2024-02-12 ENCOUNTER — Telehealth: Payer: Self-pay | Admitting: Cardiology

## 2024-02-12 ENCOUNTER — Ambulatory Visit: Admitting: Physician Assistant

## 2024-02-12 NOTE — Telephone Encounter (Signed)
 Pt would like to discuss results from heart monitor please advise

## 2024-02-12 NOTE — Telephone Encounter (Signed)
 Left detailed message informing pt that forwarding to MD for review/advisement.

## 2024-02-14 ENCOUNTER — Ambulatory Visit: Payer: Self-pay | Admitting: Cardiology

## 2024-02-14 NOTE — Telephone Encounter (Signed)
 The patient has been notified of the result and verbalized understanding.  All questions (if any) were answered. George Pifer Chauvigne, RN 02/14/2024 2:23 PM  Patient is aware that scheduling team will reach out to arrange.

## 2024-02-19 ENCOUNTER — Other Ambulatory Visit: Payer: Self-pay | Admitting: Surgery

## 2024-02-19 DIAGNOSIS — R1011 Right upper quadrant pain: Secondary | ICD-10-CM

## 2024-02-20 ENCOUNTER — Inpatient Hospital Stay: Admission: RE | Admit: 2024-02-20 | Discharge: 2024-02-20 | Attending: Surgery

## 2024-02-20 DIAGNOSIS — R1011 Right upper quadrant pain: Secondary | ICD-10-CM

## 2024-02-29 ENCOUNTER — Telehealth: Payer: Self-pay

## 2024-02-29 ENCOUNTER — Telehealth (HOSPITAL_BASED_OUTPATIENT_CLINIC_OR_DEPARTMENT_OTHER): Payer: Self-pay | Admitting: *Deleted

## 2024-02-29 NOTE — Telephone Encounter (Signed)
° °  Pre-operative Risk Assessment    Patient Name: George Peterson  DOB: 08-02-1985 MRN: [id number]   Date of last office visit: 01/05/2024 Date of next office visit: 04/16/2023 Request for Surgical Clearance    Procedure:  Gall bladder surgery  Date of Surgery:  Clearance TBD                                 Surgeon:  Dr. Deward Foy Surgeon's Group or Practice Name:  [facility] Phone number:  [phone] Fax number:  [phone]   Type of Clearance Requested:   - Medical    Type of Anesthesia:  General    Additional requests/questions:    Signed, Edsel Grayce Sanders   02/29/2024, 8:02 AM

## 2024-02-29 NOTE — Telephone Encounter (Signed)
 Pt scheduled for VV on 12/18

## 2024-02-29 NOTE — Telephone Encounter (Signed)
°  Patient Consent for Virtual Visit        George Peterson has provided verbal consent on 02/29/2024 for a virtual visit (video or telephone).   CONSENT FOR VIRTUAL VISIT FOR:  George Peterson  By participating in this virtual visit I agree to the following:  I hereby voluntarily request, consent and authorize [facility] and its employed or contracted physicians, physician assistants, nurse practitioners or other licensed health care professionals (the Practitioner), to provide me with telemedicine health care services (the Services) as deemed necessary by the treating Practitioner. I acknowledge and consent to receive the Services by the Practitioner via telemedicine. I understand that the telemedicine visit will involve communicating with the Practitioner through live audiovisual communication technology and the disclosure of certain medical information by electronic transmission. I acknowledge that I have been given the opportunity to request an in-person assessment or other available alternative prior to the telemedicine visit and am voluntarily participating in the telemedicine visit.  I understand that I have the right to withhold or withdraw my consent to the use of telemedicine in the course of my care at any time, without affecting my right to future care or treatment, and that the Practitioner or I may terminate the telemedicine visit at any time. I understand that I have the right to inspect all information obtained and/or recorded in the course of the telemedicine visit and may receive copies of available information for a reasonable fee.  I understand that some of the potential risks of receiving the Services via telemedicine include:  Delay or interruption in medical evaluation due to technological equipment failure or disruption; Information transmitted may not be sufficient (e.g. poor resolution of images) to allow for appropriate medical decision making by the  Practitioner; and/or  In rare instances, security protocols could fail, causing a breach of personal health information.  Furthermore, I acknowledge that it is my responsibility to provide information about my medical history, conditions and care that is complete and accurate to the best of my ability. I acknowledge that Practitioner's advice, recommendations, and/or decision may be based on factors not within their control, such as incomplete or inaccurate data provided by me or distortions of diagnostic images or specimens that may result from electronic transmissions. I understand that the practice of medicine is not an exact science and that Practitioner makes no warranties or guarantees regarding treatment outcomes. I acknowledge that a copy of this consent can be made available to me via my patient portal [facility] MyChart), or I can request a printed copy by calling the office of [facility].    I understand that my insurance will be billed for this visit.   I have read or had this consent read to me. I understand the contents of this consent, which adequately explains the benefits and risks of the Services being provided via telemedicine.  I have been provided ample opportunity to ask questions regarding this consent and the Services and have had my questions answered to my satisfaction. I give my informed consent for the services to be provided through the use of telemedicine in my medical care

## 2024-02-29 NOTE — Telephone Encounter (Signed)
° °  Name: George Peterson  DOB: 1985-04-14  MRN: [id number]  Primary Cardiologist: Dorn Lesches, MD   Preoperative team, please contact this patient and set up a phone call appointment for further preoperative risk assessment. Please obtain consent and complete medication review. Thank you for your help.  I confirm that guidance regarding antiplatelet and oral anticoagulation therapy has been completed and, if necessary, noted below.  I also confirmed the patient resides in the state of [place] . As per [facility] Medical Board telemedicine laws, the patient must reside in the state in which the provider is licensed.   Lum LITTIE Louis, NP 02/29/2024, 8:17 AM [facility]

## 2024-03-04 ENCOUNTER — Ambulatory Visit: Admitting: Gastroenterology

## 2024-03-04 ENCOUNTER — Encounter: Payer: Self-pay | Admitting: Gastroenterology

## 2024-03-04 VITALS — BP 116/74 | HR 84 | Ht 68.0 in | Wt 157.5 lb

## 2024-03-04 DIAGNOSIS — R1319 Other dysphagia: Secondary | ICD-10-CM

## 2024-03-04 DIAGNOSIS — R110 Nausea: Secondary | ICD-10-CM

## 2024-03-04 NOTE — Patient Instructions (Signed)
 Contact our office in a few months after you have recovered from gallbladder surgery.   Continue omeprazole  daily.   _______________________________________________________  If your blood pressure at your visit was 140/90 or greater, please contact your primary care physician to follow up on this.  _______________________________________________________  If you are age 37 or older, your body mass index should be between 23-30. Your Body mass index is 23.95 kg/m. If this is out of the aforementioned range listed, please consider follow up with your Primary Care Provider.  If you are age 99 or younger, your body mass index should be between 19-25. Your Body mass index is 23.95 kg/m. If this is out of the aformentioned range listed, please consider follow up with your Primary Care Provider.   ________________________________________________________  The [facility] providers would like to encourage you to use MYCHART to communicate with providers for non-urgent requests or questions.  Due to long hold times on the telephone, sending your provider a message by [facility] may be a faster and more efficient way to get a response.  Please allow 48 business hours for a response.  Please remember that this is for non-urgent requests.  _______________________________________________________  [facility] is using a team-based approach to care.  Your team is made up of your doctor and two to three APPS. Our APPS (Nurse Practitioners and Physician Assistants) work with your physician to ensure care continuity for you. They are fully qualified to address your health concerns and develop a treatment plan. They communicate directly with your gastroenterologist to care for you. Seeing the Advanced Practice Practitioners on your physician's team can help you by facilitating care more promptly, often allowing for earlier appointments, access to diagnostic testing, procedures, and other specialty  referrals.

## 2024-03-04 NOTE — Progress Notes (Signed)
 HPI : George Peterson is a 38 y.o. male with chronic nausea and dysphagia who presents for follow up.  He was last seen in our office in October by Nestor Blower at which time a barium esophagram was ordered and he was referred to surgery to consider cholecystectomy given his persistent nausea and borderline gallbladder ejection fraction on HIDA scan. Since then, his esophagram was performed and was unremarkable, with no evidence of dysmotility or esophageal stricture or narrowing. He was seen by General Surgery and has been recommended to undergo cholecystectomy to help with his chronic nausea.  He is waiting to hear back on a surgery date.  Today, he reports no significant changes in his symptoms.  He continues to have frequent sensations that food is sitting in his chest.  This improves with swallowing water, and so he usually takes a drink of water after every bite or two.  He notices dysphagia more with bulky foods like breads and rice.  He does not eat meat, but will also notice some dysphagia to meat substitutes.  He has not had any episodes of having to forcefully vomit stuck food.  Previous radiographic evaluation  RUQUS Feb 20, 2024 IMPRESSION: Normal right upper quadrant ultrasound.  Barium esophagram Nov 2025 FINDINGS: No evidence of vestibular penetration or aspiration during swallowing. Pharynx and cervical esophagus are unremarkable.   No evidence of esophageal mass or stricture. No findings of esophagitis noted. Esophageal motility is within normal limits.   No evidence of hiatal hernia or gastroesophgeal reflux. An ingested 13mm barium tablet passed freely through the esophagus, and into the stomach.   IMPRESSION: Normal esophagram.    HIDA scan Jan 2025 FINDINGS: Prompt uptake and biliary excretion of activity by the liver is seen. Gallbladder activity is visualized, consistent with patency of cystic duct. Biliary activity passes into small bowel,  consistent with patent common bile duct.   Calculated gallbladder ejection fraction is 34%. (Normal gallbladder ejection fraction with Ensure is greater than 33% and less than 80%.)   IMPRESSION: 1.  Patent cystic and common bile ducts.   2.  Normal gallbladder ejection fraction.    Previous endoscopic evaluation EGD September 29, 2023 - Normal esophagus.  - Normal stomach.  - Normal examined duodenum.   FINAL DIAGNOSIS        1. Surgical [P], distal esophagus :       UNREMARKABLE SQUAMOUS MUCOSA.       NEGATIVE FOR EOSINOPHILIC ESOPHAGITIS.        2. Surgical [P], proximal esophagus :       UNREMARKABLE SQUAMOUS MUCOSA.       NEGATIVE FOR EOSINOPHILIC ESOPHAGITIS.   EGD June 30, 2023   - Tortuous esophagus.  - Normal mucosa was found in the entire esophagus.  - Normal stomach. Biopsied.  - Duodenal erosions without bleeding. Biopsied.  - Biopsies were taken with a cold forceps for evaluation of eosinophilic esophagitis.  - No endoscopic abnormalities to explain dysphagia.   FINAL DIAGNOSIS       1. Surgical [P], duodenal bulb :      REACTIVE DUODENAL MUCOSA WITH FOCAL GASTRIC METAPLASIA COMPATIBLE PEPTIC      DUODENITIS       2. Surgical [P], gastric :      MINIMAL CHRONIC GASTRITIS WITH REACTIVE EPITHELIAL CHANGES      NEGATIVE FOR H. PYLORI, INTESTINAL METAPLASIA, DYSPLASIA AND CARCINOMA       3. Surgical [P], distal esophagus :  EOSINOPHIL RICH ESOPHAGITIS (SEE MICROSCOPIC COMMENT)      MILD CHRONIC GASTRITIS      NEGATIVE FOR INTESTINAL METAPLASIA, DYSPLASIA AND CARCINOMA       4. Surgical [P], proximal esophagus :      REACTIVE SQUAMOUS MUCOSA      NEGATIVE FOR GLANDULAR EPITHELIUM, EOSINOPHILS, DYSPLASIA AND CARCINOMA    . 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 the differential diagnosis of which would include  severe reflux esophagitis and true eosinophilic esophagitis.Clinical and  endoscopic correlation is recommended   Past Medical History:  Diagnosis Date   Allergy    Kidney stones    multiple stones passed as child, had lithotripsy once   Psoriasis      Past Surgical History:  Procedure Laterality Date   EXTRACORPOREAL SHOCK WAVE LITHOTRIPSY Left 12/12/2019   Procedure: LEFT EXTRACORPOREAL SHOCK WAVE LITHOTRIPSY (ESWL);  Surgeon: Rosalind Zachary NOVAK, MD;  Location: [facility];  Service: Urology;  Laterality: Left;   LITHOTRIPSY  2007   lymph node biospy     with splenic infection   PILONIDAL CYST EXCISION  2008   TRANSESOPHAGEAL ECHOCARDIOGRAM (CATH LAB) N/A 06/29/2023   Procedure: TRANSESOPHAGEAL ECHOCARDIOGRAM;  Surgeon: Lonni Slain, MD;  Location: [facility] INVASIVE CV LAB;  Service: Cardiovascular;  Laterality: N/A;   WISDOM TOOTH EXTRACTION     Family History  Problem Relation Age of Onset   Hypertension Mother    Hyperlipidemia Mother    Colon polyps Mother    Skin cancer Mother    Arthritis Father    Kidney Stones Father    ALS Father    AAA (abdominal aortic aneurysm) Maternal Grandfather    Heart disease Paternal Grandfather    Colon cancer Neg Hx    Esophageal cancer Neg Hx    Rectal cancer Neg Hx    Stomach cancer Neg Hx    Social History[1] Current Outpatient Medications  Medication Sig Dispense Refill   acetaminophen  (TYLENOL ) 500 MG tablet Take 500 mg by mouth every 6 (six) hours as needed for headache.      finasteride (PROPECIA) 1 MG tablet Take 1 mg by mouth daily.     fluticasone (FLONASE) 50 MCG/ACT nasal spray Place 1 spray into both nostrils daily as needed for allergies or rhinitis.     loratadine (CLARITIN) 10 MG tablet Take 10 mg by mouth daily.     Multiple  Vitamin (MULTIVITAMIN WITH MINERALS) TABS tablet Take 1 tablet by mouth daily.     omeprazole  (PRILOSEC) 20 MG capsule Take 1 capsule (20 mg total) by mouth 2 (two) times daily. 180 capsule 3   SKYRIZI PEN 150 MG/ML pen Inject 150 mg into the skin See admin instructions. Every 12 weeks     No current facility-administered medications for this visit.   Allergies[2]   Review of Systems: All systems reviewed and negative except where noted in HPI.    US  Abdomen Limited RUQ (LIVER/GB) Result Date: 02/20/2024 CLINICAL DATA:  Right upper quadrant pain EXAM: ULTRASOUND ABDOMEN LIMITED RIGHT UPPER QUADRANT COMPARISON:  None Available. FINDINGS: Gallbladder: No gallstones or wall thickening visualized. No sonographic Murphy sign noted by sonographer. Common bile duct: Diameter: Normal at 2 mm Liver: No focal lesion identified. Within normal limits  in parenchymal echogenicity. Portal vein is patent on color Doppler imaging with normal direction of blood flow towards the liver. Other: None. IMPRESSION: Normal right upper quadrant ultrasound. Electronically Signed   By: Wilkie Lent M.D.   On: 02/20/2024 09:20   LONG TERM MONITOR (3-14 DAYS) Result Date: 02/06/2024 Patch Wear Time:  13 days and 23 hours HR 46 - 190, average 78 bpm. 3 nonsustained SVT (longest 6 beats) and 1 nonsustained VT (longest 21 beats) No atrial fibrillation detected. Rare supraventricular ectopy. Rare ventricular ectopy. No sustained arrhythmias. Symptom trigger episodes correspond to sinus rhythm and sinus rhythm with NSVT [facility]   Physical Exam: BP 116/74   Pulse 84   Ht 5' 8 (1.727 m)   Wt 157 lb 8 oz (71.4 kg)   SpO2 98%   BMI 23.95 kg/m  Constitutional: Pleasant,well-developed, Caucasian male in no acute distress. HEENT: Normocephalic and atraumatic. Conjunctivae are normal. No scleral icterus. Neck supple.  Cardiovascular: Normal rate, regular rhythm.  Pulmonary/chest:  Effort normal and breath sounds normal. No wheezing, rales or rhonchi. Abdominal: Soft, nondistended, nontender. Bowel sounds active throughout. There are no masses palpable. No hepatomegaly. Extremities: no edema Lymphadenopathy: No cervical adenopathy noted. Neurological: Alert and oriented to person place and time. Skin: Skin is warm and dry. No rashes noted. Psychiatric: Normal mood and affect. Behavior is normal.  CBC    Component Value Date/Time   WBC 6.5 12/24/2023 0415   RBC 4.80 12/24/2023 0415   HGB 14.2 12/24/2023 0415   HGB 14.3 06/28/2023 1010   HCT 39.3 12/24/2023 0415   HCT 40.9 06/28/2023 1010   PLT 141 (L) 12/24/2023 0415   PLT 178 06/28/2023 1010   MCV 81.9 12/24/2023 0415   MCV 87 06/28/2023 1010   MCH 29.6 12/24/2023 0415   MCHC 36.1 (H) 12/24/2023 0415   RDW 11.9 12/24/2023 0415   RDW 11.9 06/28/2023 1010   LYMPHSABS 2.5 12/24/2023 0415   MONOABS 0.4 12/24/2023 0415   EOSABS 0.1 12/24/2023 0415   BASOSABS 0.0 12/24/2023 0415    CMP     Component Value Date/Time   NA 140 01/10/2024 0842   NA 141 06/28/2023 1010   K 3.8 01/10/2024 0842   CL 103 01/10/2024 0842   CO2 29 01/10/2024 0842   GLUCOSE 95 01/10/2024 0842   BUN 9 01/10/2024 0842   BUN 8 06/28/2023 1010   CREATININE 0.90 01/10/2024 0842   CALCIUM 10.0 01/10/2024 0842   PROT 7.3 01/10/2024 0842   ALBUMIN 4.8 01/10/2024 0842   AST 20 01/10/2024 0842   ALT 19 01/10/2024 0842   ALKPHOS 44 01/10/2024 0842   BILITOT 0.9 01/10/2024 0842   GFRNONAA >60 12/24/2023 0415   GFRAA >60 12/12/2019 1914       Latest Ref Rng & Units 12/24/2023    4:15 AM 06/28/2023   10:10 AM 03/02/2023    6:30 PM  CBC EXTENDED  WBC 4.0 - 10.5 K/uL 6.5  6.0  6.1   RBC 4.22 - 5.81 MIL/uL 4.80  4.73  4.95   Hemoglobin 13.0 - 17.0 g/dL 85.7  85.6  85.2   HCT 39.0 - 52.0 % 39.3  40.9  41.6   Platelets 150 - 400 K/uL 141  178  189   NEUT# 1.7 - 7.7 K/uL 3.4   3.5   Lymph# 0.7 - 4.0 K/uL 2.5   2.1        ASSESSMENT AND PLAN:  38 year old male with longstanding  symptoms of nausea, dyspepsia and dysphagia.  His initial upper endoscopy showed elevated levels of eosinophils concerning for eosinophilic esophagitis, but a repeat upper endoscopy a few months later showed normal esophageal biopsies.  He continues to report regular symptoms of solid dysphagia.  Barium esophagram showed essentially normal esophageal motility and no obvious stricture or stenosis that may have been missed on the upper endoscopy. Although it is possible that there may have been sampling error on his second upper endoscopy, I think this is less likely given the normal endoscopic appearance of his esophagus. We discussed repeating an upper endoscopy with biopsies and performing empiric dilation versus an empiric trial of swallowed steroids for possible eosinophilic esophagitis versus continued monitoring/observation. The patient opted to continue to monitor/observe, particularly while undergoing evaluation for cholecystectomy.  This seems very reasonable.  Advised patient to contact us  if his symptoms worsen, and we can expedite a repeat upper endoscopy with repeat biopsies and empiric dilation.  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 Stacia, MD [facility]   Ozell Heron HERO, MD     [1]  Social History Tobacco Use   Smoking status: Never   Smokeless tobacco: Never  Vaping Use   Vaping status: Never Used  Substance Use Topics   Alcohol  use: Yes    Comment: occ, 2-4 drinks a few times per month   Drug use: No  [2] No Known Allergies

## 2024-03-07 ENCOUNTER — Ambulatory Visit: Attending: Cardiovascular Disease

## 2024-03-07 DIAGNOSIS — Z01810 Encounter for preprocedural cardiovascular examination: Secondary | ICD-10-CM

## 2024-03-07 NOTE — Progress Notes (Signed)
 Virtual Visit via Telephone Note   Because of [place] co-morbid illnesses, he is at least at moderate risk for complications without adequate follow up.  This format is felt to be most appropriate for this patient at this time.  Due to technical limitations with video connection (technology), today's appointment will be conducted as an audio only telehealth visit, and George Peterson verbally agreed to proceed in this manner.   All issues noted in this document were discussed and addressed.  No physical exam could be performed with this format.  Evaluation Performed:  Preoperative cardiovascular risk assessment _____________   Date:  03/07/2024   Patient ID:  George Peterson, DOB Mar 02, 1986, MRN [id number] Patient Location:  Home Provider location:   Office  Primary Care Provider:  Ozell Heron HERO, MD Primary Cardiologist:  Dorn Lesches, MD  Chief Complaint / Patient Profile   38 y.o. y/o male with a h/o potential TIA, psoriasis, who is pending cholecystectomy and presents today for telephonic preoperative cardiovascular risk assessment.  History of Present Illness    George Peterson is a 38 y.o. male who presents via audio/video conferencing for a telehealth visit today.  Pt was last seen in cardiology clinic on 01/05/2024 by Dr. Inocencio.  At that time George Peterson was having near syncopal episodes that were possibly arrhythmogenic in nature, implantable loop recorder recommended, otherwise doing well.  The patient is now pending procedure as outlined above. He describes a new lower right chest/abdominal pain occurring w/ eating, not occurring with exercise, improved since changing his diet. Infrequent palpitations, less frequent since changing his diet, not associated with any dizziness or near syncope. Denies SOB, dizziness, near syncope, dark/tarry/bloody stools, hematuria, weight changes, edema. Walks 1-2 miles on his lunch break at work frequently throughout the week  without cardiac symptoms.   Past Medical History    Past Medical History:  Diagnosis Date   Allergy    Kidney stones    multiple stones passed as child, had lithotripsy once   Psoriasis    Past Surgical History:  Procedure Laterality Date   EXTRACORPOREAL SHOCK WAVE LITHOTRIPSY Left 12/12/2019   Procedure: LEFT EXTRACORPOREAL SHOCK WAVE LITHOTRIPSY (ESWL);  Surgeon: Rosalind Zachary NOVAK, MD;  Location: [facility];  Service: Urology;  Laterality: Left;   LITHOTRIPSY  2007   lymph node biospy     with splenic infection   PILONIDAL CYST EXCISION  2008   TRANSESOPHAGEAL ECHOCARDIOGRAM (CATH LAB) N/A 06/29/2023   Procedure: TRANSESOPHAGEAL ECHOCARDIOGRAM;  Surgeon: Lonni Slain, MD;  Location: [facility] INVASIVE CV LAB;  Service: Cardiovascular;  Laterality: N/A;   WISDOM TOOTH EXTRACTION      Allergies  Allergies[1]  Home Medications    Prior to Admission medications  Medication Sig Start Date End Date Taking? Authorizing Provider  acetaminophen  (TYLENOL ) 500 MG tablet Take 500 mg by mouth every 6 (six) hours as needed for headache.     [provider]  finasteride (PROPECIA) 1 MG tablet Take 1 mg by mouth daily.    [provider]  fluticasone (FLONASE) 50 MCG/ACT nasal spray Place 1 spray into both nostrils daily as needed for allergies or rhinitis.    [provider]  loratadine (CLARITIN) 10 MG tablet Take 10 mg by mouth daily.    [provider]  Multiple Vitamin (MULTIVITAMIN WITH MINERALS) TABS tablet Take 1 tablet by mouth daily.    [provider]  omeprazole  (PRILOSEC) 20 MG capsule Take 1 capsule (20 mg total) by mouth  2 (two) times daily. 08/31/23   Stacia Glendia BRAVO, MD  SKYRIZI PEN 150 MG/ML pen Inject 150 mg into the skin See admin instructions. Every 12 weeks 11/10/22   [provider]    Physical Exam    Vital Signs:  George Peterson does not have vital signs available for review  today.  Given telephonic nature of communication, physical exam is limited. AAOx3. NAD. Normal affect.  Speech and respirations are unlabored.  Accessory Clinical Findings    None  Assessment & Plan    1.  Preoperative Cardiovascular Risk Assessment: According to the Revised Cardiac Risk Index (RCRI), his Perioperative Risk of Major Cardiac Event is (%): 0.9  His Functional Capacity in METs is: 6.05 according to the [facility] Activity Status Index (DASI). Therefore, based on ACC/AHA guidelines, patient would be at acceptable risk for the planned procedure without further cardiovascular testing.   The patient was advised that if he develops new symptoms prior to surgery to contact our office to arrange for a follow-up visit, and he verbalized understanding.  A copy of this note will be routed to requesting surgeon.  Time:   Today, I have spent 10 minutes with the patient with telehealth technology discussing medical history, symptoms, and management plan.     George Peterson E Kj Imbert, NP  03/07/2024, 8:15 AM     [1] No Known Allergies

## 2024-03-11 NOTE — Pre-Procedure Instructions (Signed)
 Surgical Instructions   Your procedure is scheduled on Monday, December 29 Report to [facility] Main Entrance A at 0530 A.M., then check in with the Admitting office. Any questions or running late day of surgery: call [phone]  Questions prior to your surgery date: call [phone], Monday-Friday, 8am-4pm. If you experience any cold or flu symptoms such as cough, fever, chills, shortness of breath, etc. between now and your scheduled surgery, please notify us  at the above number.     Remember:  Do not eat after midnight the night before your surgery   You may drink clear liquids until 0430 the morning of your surgery.   Clear liquids allowed are: Water, Non-Citrus Juices (without pulp), Carbonated Beverages, Clear Tea (no milk, honey, etc.), Black Coffee Only (NO MILK, CREAM OR POWDERED CREAMER of any kind), and Gatorade.    Take these medicines the morning of surgery with A SIP OF WATER: finasteride (PROPECIA) omeprazole  (PRILOSEC)   May take these medicines IF NEEDED: acetaminophen  (TYLENOL )  cetirizine (ZYRTEC)  fluticasone (FLONASE)    One week prior to surgery, STOP taking any Aspirin  (unless otherwise instructed by your surgeon) Aleve, Naproxen, Ibuprofen, Motrin, Advil, Goody's, BC's, all herbal medications, fish oil, and non-prescription vitamins.                     Do NOT Smoke (Tobacco/Vaping) for 24 hours prior to your procedure.  If you use a CPAP at night, you may bring your mask/headgear for your overnight stay.   You will be asked to remove any contacts, glasses, piercing's, hearing aid's, dentures/partials prior to surgery. Please bring cases for these items if needed.    Patients discharged the day of surgery will not be allowed to drive home, and someone needs to stay with them for 24 hours.  SURGICAL WAITING ROOM VISITATION Patients may have no more than 2 support people in the waiting area - these visitors may rotate.   Pre-op nurse will coordinate  an appropriate time for 1 ADULT support person, who may not rotate, to accompany patient in pre-op.  Children under the age of 51 must have an adult with them who is not the patient and must remain in the main waiting area with an adult.  If the patient needs to stay at the hospital during part of their recovery, the visitor guidelines for inpatient rooms apply.  Please refer to the [facility] website for the visitor guidelines for any additional information.   If you received a COVID test during your pre-op visit  it is requested that you wear a mask when out in public, stay away from anyone that may not be feeling well and notify your surgeon if you develop symptoms. If you have been in contact with anyone that has tested positive in the last 10 days please notify you surgeon.      Pre-operative CHG Bathing Instructions   You can play a key role in reducing the risk of infection after surgery. Your skin needs to be as free of germs as possible. You can reduce the number of germs on your skin by washing with CHG (chlorhexidine gluconate) soap before surgery. CHG is an antiseptic soap that kills germs and continues to kill germs even after washing.   DO NOT use if you have an allergy to chlorhexidine/CHG or antibacterial soaps. If your skin becomes reddened or irritated, stop using the CHG and notify one of our RNs at [phone].  TAKE A SHOWER THE NIGHT BEFORE SURGERY   Please keep in mind the following:  DO NOT shave, including legs and underarms, 48 hours prior to surgery.   You may shave your face before/day of surgery.  Place clean sheets on your bed the night before surgery Use a clean washcloth (not used since being washed) for shower. DO NOT sleep with pet's night before surgery.  CHG Shower Instructions:  Wash your face and private area with normal soap. If you choose to wash your hair, wash first with your normal shampoo.  After you use shampoo/soap, rinse your  hair and body thoroughly to remove shampoo/soap residue.  Turn the water OFF and apply half the bottle of CHG soap to a CLEAN washcloth.  Apply CHG soap ONLY FROM YOUR NECK DOWN TO YOUR TOES (washing for 3-5 minutes)  DO NOT use CHG soap on face, private areas, open wounds, or sores.  Pay special attention to the area where your surgery is being performed.  If you are having back surgery, having someone wash your back for you may be helpful. Wait 2 minutes after CHG soap is applied, then you may rinse off the CHG soap.  Pat dry with a clean towel  Put on clean pajamas    Additional instructions for the day of surgery: If you choose, you may shower the morning of surgery with an antibacterial soap.  DO NOT APPLY any lotions, deodorants, cologne, or perfumes.   Do not wear jewelry or makeup Do not wear nail polish, gel polish, artificial nails, or any other type of covering on natural nails (fingers and toes) Do not bring valuables to the hospital. [facility] is not responsible for valuables/personal belongings. Put on clean/comfortable clothes.  Please brush your teeth.  Ask your nurse before applying any prescription medications to the skin.

## 2024-03-12 ENCOUNTER — Other Ambulatory Visit: Payer: Self-pay

## 2024-03-12 ENCOUNTER — Encounter (HOSPITAL_COMMUNITY)
Admission: RE | Admit: 2024-03-12 | Discharge: 2024-03-12 | Disposition: A | Discharge status: Home / Self Care | Source: Ambulatory Visit | Attending: Surgery | Admitting: Surgery

## 2024-03-12 ENCOUNTER — Encounter (HOSPITAL_COMMUNITY): Payer: Self-pay

## 2024-03-12 VITALS — BP 128/95 | HR 87 | Temp 98.1°F | Resp 18 | Ht 68.0 in | Wt 157.5 lb

## 2024-03-12 DIAGNOSIS — Z87442 Personal history of urinary calculi: Secondary | ICD-10-CM | POA: Diagnosis not present

## 2024-03-12 DIAGNOSIS — Z01818 Encounter for other preprocedural examination: Secondary | ICD-10-CM | POA: Diagnosis present

## 2024-03-12 DIAGNOSIS — Z01812 Encounter for preprocedural laboratory examination: Secondary | ICD-10-CM | POA: Insufficient documentation

## 2024-03-12 DIAGNOSIS — L409 Psoriasis, unspecified: Secondary | ICD-10-CM | POA: Insufficient documentation

## 2024-03-12 DIAGNOSIS — K219 Gastro-esophageal reflux disease without esophagitis: Secondary | ICD-10-CM | POA: Insufficient documentation

## 2024-03-12 DIAGNOSIS — K828 Other specified diseases of gallbladder: Secondary | ICD-10-CM | POA: Insufficient documentation

## 2024-03-12 HISTORY — DX: Personal history of urinary calculi: Z87.442

## 2024-03-12 HISTORY — DX: Gastro-esophageal reflux disease without esophagitis: K21.9

## 2024-03-12 LAB — BASIC METABOLIC PANEL WITH GFR
Anion gap: 9 (ref 5–15)
BUN: 8 mg/dL (ref 6–20)
CO2: 29 mmol/L (ref 22–32)
Calcium: 9.6 mg/dL (ref 8.9–10.3)
Chloride: 102 mmol/L (ref 98–111)
Creatinine, Ser: 0.88 mg/dL (ref 0.61–1.24)
GFR, Estimated: >60 mL/min
Glucose, Bld: 96 mg/dL (ref 70–99)
Potassium: 4.3 mmol/L (ref 3.5–5.1)
Sodium: 139 mmol/L (ref 135–145)

## 2024-03-12 LAB — CBC
HCT: 41.8 % (ref 39.0–52.0)
Hemoglobin: 14.8 g/dL (ref 13.0–17.0)
MCH: 29.8 pg (ref 26.0–34.0)
MCHC: 35.4 g/dL (ref 30.0–36.0)
MCV: 84.1 fL (ref 80.0–100.0)
Platelets: 173 K/uL (ref 150–400)
RBC: 4.97 MIL/uL (ref 4.22–5.81)
RDW: 12.4 % (ref 11.5–15.5)
WBC: 6.5 K/uL (ref 4.0–10.5)
nRBC: 0 % (ref 0.0–0.2)

## 2024-03-12 NOTE — Progress Notes (Signed)
 PCP - Dr Heron Sharper Cardiologist - Dr Dorn Lesches EP -  Dr Soyla Norton (clearance on 03/07/24)  CT Chest x-ray - 12/24/23 EKG - 12/26/23 Stress Test - n/a ECHO TEE - 06/29/23 Cardiac Cath - n/a  ICD Pacemaker/Loop - n/a  Sleep Study -  n/a  Diabetes - n/a  Aspirin  & Blood Thinner Instructions:  n/a  ERAS - clear liquids til 0430 DOS,  Anesthesia review: Yes  STOP now taking any Aspirin  (unless otherwise instructed by your surgeon), Aleve, Naproxen, Ibuprofen, Motrin, Advil, Goody's, BC's, all herbal medications, fish oil, and all vitamins.   Coronavirus Screening Do you have any of the following symptoms:  Cough yes/no: No Fever (>100.6F)  yes/no: No Runny nose yes/no: No Sore throat yes/no: No Difficulty breathing/shortness of breath  yes/no: No  Have you traveled in the last 14 days and where? yes/no: No  Patient verbalized understanding of instructions that were given to them at the PAT appointment. Patient was also instructed that they will need to review over the PAT instructions again at home before surgery.

## 2024-03-13 NOTE — Anesthesia Preprocedure Evaluation (Addendum)
"                                    Anesthesia Evaluation  Patient identified by MRN, date of birth, ID band Patient awake    Reviewed: Allergy & Precautions, NPO status , Patient's Chart, lab work & pertinent test results  History of Anesthesia Complications Negative for: history of anesthetic complications  Airway Mallampati: II  TM Distance: >3 FB Neck ROM: Full    Dental no notable dental hx.    Pulmonary neg pulmonary ROS   Pulmonary exam normal        Cardiovascular Normal cardiovascular exam  Possible small PFO   Neuro/Psych TIA (02/2023)   GI/Hepatic Neg liver ROS,GERD  Medicated and Controlled,,Biliary dyskinesia   Endo/Other  negative endocrine ROS    Renal/GU negative Renal ROS     Musculoskeletal negative musculoskeletal ROS (+)    Abdominal   Peds  Hematology negative hematology ROS (+)   Anesthesia Other Findings   Reproductive/Obstetrics                              Anesthesia Physical Anesthesia Plan  ASA: 3  Anesthesia Plan: General   Post-op Pain Management: Tylenol  PO (pre-op)* and Toradol  IV (intra-op)*   Induction: Intravenous  PONV Risk Score and Plan: 3 and Treatment may vary due to age or medical condition, Ondansetron , Dexamethasone , Midazolam , Scopolamine  patch - Pre-op and Propofol  infusion  Airway Management Planned: Oral ETT  Additional Equipment: None  Intra-op Plan:   Post-operative Plan: Extubation in OR  Informed Consent: I have reviewed the patients History and Physical, chart, labs and discussed the procedure including the risks, benefits and alternatives for the proposed anesthesia with the patient or authorized representative who has indicated his/her understanding and acceptance.     Dental advisory given  Plan Discussed with: CRNA  Anesthesia Plan Comments: (PAT note written 03/13/2024 by Allison Zelenak, PA-C.  )         Anesthesia Quick Evaluation  "

## 2024-03-13 NOTE — Progress Notes (Signed)
 Anesthesia Chart Review:  Case: [id number] Date/Time: 03/18/24 0715   Procedure: LAPAROSCOPIC CHOLECYSTECTOMY   Anesthesia type: General   Diagnosis: Biliary dyskinesia [K82.8]   Pre-op diagnosis: BILIARY DYSKINESIA   [facility] / [facility]   Surgeons: Stechschulte, Deward PARAS, MD       DISCUSSION: Patient is a 38 year old male scheduled for the above procedure.  History includes never smoker, psoriasis, GERD, nephrolithiasis (s/p lithotripsy 2021), possible TIA (episode of left face/LUE N/T 03/02/2023, MRI brain negative, had neurology and cardiology evaluations), syncope (12/24/2023).  TEE on 06/29/2023 showed:  CONCLUSION: Very small area of color seen across intra-atrial septum, possible very small ASD. 3D images of this taken that show trivial color flow across the septum. No cardiac right to left shunt seen with agitated saline, one late bubble with cough which may represent extracardiac shunt.   More recently seen by cardiologist Dr. Court following syncopal event and then seen by EP Dr. Inocencio. If long term monitor was unremarkable then may consider a loop recorder in the future.  Preoperative cardiology risk assessment outlined on 03/07/2024 by Elaine Moloney, NP and noted, Pt was last seen in cardiology clinic on 01/05/2024 by Dr. Inocencio.  At that time George Peterson was having near syncopal episodes that were possibly arrhythmogenic in nature, implantable loop recorder recommended, otherwise doing well.  The patient is now pending procedure as outlined above. He describes a new lower right chest/abdominal pain occurring w/ eating, not occurring with exercise, improved since changing his diet. Infrequent palpitations, less frequent since changing his diet, not associated with any dizziness or near syncope. Denies SOB, dizziness, near syncope, dark/tarry/bloody stools, hematuria, weight changes, edema. Walks 1-2 miles on his lunch break at work frequently throughout the week without  cardiac symptoms. In regards to CV risk assessment: According to the Revised Cardiac Risk Index (RCRI), his Perioperative Risk of Major Cardiac Event is (%): 0.9   His Functional Capacity in METs is: 6.05 according to the Duke Activity Status Index (DASI). Therefore, based on ACC/AHA guidelines, patient would be at acceptable risk for the planned procedure without further cardiovascular testing.    Anesthesia team to evaluate on the day of surgery.   VS: BP (!) 128/95   Pulse 87   Temp 36.7 C   Resp 18   Ht 5' 8 (1.727 m)   Wt 71.4 kg   SpO2 99%   BMI 23.95 kg/m    PROVIDERS: Ozell Heron HERO, MD is PCP George Carrier, MD is cardiologist Inocencio Chi, MD is EP cardiologist Stacia Hamilton, MD is GI Ines Paul, MD is neurologist   LABS: Labs reviewed: Acceptable for surgery. (all labs ordered are listed, but only abnormal results are displayed)  Labs Reviewed  CBC  BASIC METABOLIC PANEL WITH GFR    EEG 04/25/2023: IMPRESSION: This is a normal awake and sleep EEG. No evidence of interictal epileptiform discharges. Normal EEGs, however, do not rule out epilepsy.     IMAGES: CTA Chest 12/24/2023: IMPRESSION: 1. No pulmonary embolism or acute pulmonary abnormality.  CT Head 12/24/2023: IMPRESSION: 1. New nondisplaced fracture of the right anterior table of the frontal sinus with mild overlying soft tissue swelling.  CT head/CTA Head/Neck 04/17/2023: IMPRESSION: 1. Unremarkable noncontrast head CT with no acute intracranial pathology. 2. Normal vasculature of the head and neck.   MRI Brain 03/03/2023: IMPRESSION: Normal brain MRI. No acute intracranial abnormality identified.   EKG: 12/24/2023: Sinus rhythm Abnormal R-wave progression, early transition Borderline T wave abnormalities  No significant change was found Confirmed by Carita Senior [id number]) on 12/24/2023 4:19:32 AM   CV: Long term monitor 02/06/2024: Patch Wear Time:  13 days and 23  hours HR 46 - 190, average 78 bpm. 3 nonsustained SVT (longest 6 beats) and 1 nonsustained VT (longest 21 beats) No atrial fibrillation detected. Rare supraventricular ectopy. Rare ventricular ectopy. No sustained arrhythmias. Symptom trigger episodes correspond to sinus rhythm and sinus rhythm with NSVT    TEE 06/29/2023: IMPRESSIONS   1. Left ventricular ejection fraction, by estimation, is 60 to 65%. The  left ventricle has normal function. The left ventricle has no regional  wall motion abnormalities.   2. Right ventricular systolic function is normal. The right ventricular  size is normal.   3. Very small area of color flow across the intra-atrial septum, adjacent  to but not in usual location of PFO. Agitated saline negative for right to  left intracardiac shunt, but late positive single bubble seen with cough  consistent with potential very  small extracardiac shunt. No left atrial/left atrial appendage thrombus  was detected.   4. The mitral valve is normal in structure. Mild mitral valve  regurgitation. No evidence of mitral stenosis.   5. The aortic valve is tricuspid. Aortic valve regurgitation is not  visualized. No aortic stenosis is present.   6. Evidence of atrial level shunting detected by color flow Doppler.  Agitated saline contrast bubble study was negative, with no evidence of  any interatrial shunt.   7. 3D performed of the atrial septum and demonstrates Small atrial septal  defect with trivial left to right flow.   Conclusion(s)/Recommendation(s): Very small area of color seen across  intra-atrial septum, possible very small ASD. 3D images of this taken that  show trivial color flow across the septum. No cardiac right to left shunt  seen with agitated saline, one late   bubble with cough which may represent extracardiac shunt.    TTE 04/19/2023: IMPRESSIONS   1. Left ventricular ejection fraction, by estimation, is 60 to 65%. The  left ventricle has  normal function. The left ventricle has no regional  wall motion abnormalities. Left ventricular diastolic parameters were  normal.   2. Right ventricular systolic function is normal. The right ventricular  size is normal.   3. Cannot rule out small density off the anterior mitral valve leaflet  vs. redudant chordae tendinae. Trivial mitral valve regurgitation. No  evidence of mitral stenosis.   4. The aortic valve is tricuspid. Aortic valve regurgitation is not  visualized. Aortic valve sclerosis is present, with no evidence of aortic  valve stenosis.   5. The inferior vena cava is normal in size with greater than 50%  respiratory variability, suggesting right atrial pressure of 3 mmHg.   6. Agitated saline contrast bubble study was positive with shunting  observed after >6 cardiac cycles suggestive of intrapulmonary shunting.   Conclusion(s)/Recommendation(s): Consider a transesophageal echocardiogram  to exclude cardiac source of embolism and furthe assess mitral valve    US  Transcranial Doppler 04/19/2023: Summary:  No HITS at rest or during Valsalva. Negative transcranial Doppler Bubble  study with no evidence of right to left intracardiac communication.      Past Medical History:  Diagnosis Date   Allergy    GERD (gastroesophageal reflux disease)    History of kidney stones    multiple stones passed as child, had lithotripsy once   Psoriasis     Past Surgical History:  Procedure Laterality Date  EXTRACORPOREAL SHOCK WAVE LITHOTRIPSY Left 12/12/2019   Procedure: LEFT EXTRACORPOREAL SHOCK WAVE LITHOTRIPSY (ESWL);  Surgeon: Rosalind Zachary NOVAK, MD;  [facility];  Service: Urology;  Laterality: Left;   LITHOTRIPSY  2007   lymph node biospy     with splenic infection   PILONIDAL CYST EXCISION  2008   TRANSESOPHAGEAL ECHOCARDIOGRAM (CATH LAB) N/A 06/29/2023   Procedure: TRANSESOPHAGEAL ECHOCARDIOGRAM;  Surgeon: Lonni Slain, MD;  Location:  [facility] INVASIVE CV LAB;  Service: Cardiovascular;  Laterality: N/A;   UPPER GI ENDOSCOPY     x several   WISDOM TOOTH EXTRACTION      MEDICATIONS:  acetaminophen  (TYLENOL ) 500 MG tablet   cetirizine (ZYRTEC) 10 MG tablet   finasteride (PROPECIA) 1 MG tablet   fluticasone (FLONASE) 50 MCG/ACT nasal spray   Multiple Vitamin (MULTIVITAMIN WITH MINERALS) TABS tablet   omeprazole  (PRILOSEC) 20 MG capsule   SKYRIZI PEN 150 MG/ML pen   Tapinarof 1 % CREA   No current facility-administered medications for this encounter.     Isaiah Ruder, PA-C Surgical Short Stay/Anesthesiology [facility] Phone [phone] [facility] Phone [phone] 03/13/2024 12:12 PM

## 2024-03-18 ENCOUNTER — Ambulatory Visit (HOSPITAL_COMMUNITY): Payer: Self-pay | Admitting: Vascular Surgery

## 2024-03-18 ENCOUNTER — Encounter (HOSPITAL_COMMUNITY)
Admission: RE | Disposition: A | Discharge status: Home / Self Care | Payer: Self-pay | Source: Home / Self Care | Attending: Surgery

## 2024-03-18 ENCOUNTER — Ambulatory Visit (HOSPITAL_BASED_OUTPATIENT_CLINIC_OR_DEPARTMENT_OTHER): Admitting: Anesthesiology

## 2024-03-18 ENCOUNTER — Ambulatory Visit (HOSPITAL_COMMUNITY)
Admission: RE | Admit: 2024-03-18 | Discharge: 2024-03-18 | Disposition: A | Discharge status: Home / Self Care | Attending: Surgery | Admitting: Surgery

## 2024-03-18 DIAGNOSIS — K219 Gastro-esophageal reflux disease without esophagitis: Secondary | ICD-10-CM | POA: Diagnosis not present

## 2024-03-18 DIAGNOSIS — K828 Other specified diseases of gallbladder: Secondary | ICD-10-CM

## 2024-03-18 HISTORY — PX: CHOLECYSTECTOMY: SHX55

## 2024-03-18 SURGERY — LAPAROSCOPIC CHOLECYSTECTOMY
Anesthesia: General

## 2024-03-18 MED ORDER — MIDAZOLAM HCL 2 MG/2ML IJ SOLN
INTRAMUSCULAR | Status: AC
  Filled 2024-03-18: qty 2

## 2024-03-18 MED ORDER — PROPOFOL 500 MG/50ML IV EMUL
INTRAVENOUS | Status: DC | PRN
  Administered 2024-03-18: 25 ug/kg/min via INTRAVENOUS

## 2024-03-18 MED ORDER — 0.9 % SODIUM CHLORIDE (POUR BTL) OPTIME
TOPICAL | Status: DC | PRN
  Administered 2024-03-18: 1000 mL

## 2024-03-18 MED ORDER — KETOROLAC TROMETHAMINE 30 MG/ML IJ SOLN: 30.0000 mg | Freq: Four times a day (QID) | INTRAMUSCULAR | Status: DC | PRN

## 2024-03-18 MED ORDER — EPHEDRINE 5 MG/ML INJ
INTRAVENOUS | Status: AC
  Filled 2024-03-18: qty 5

## 2024-03-18 MED ORDER — ROCURONIUM BROMIDE 10 MG/ML (PF) SYRINGE
PREFILLED_SYRINGE | INTRAVENOUS | Status: DC | PRN
  Administered 2024-03-18: 50 mg via INTRAVENOUS

## 2024-03-18 MED ORDER — SODIUM CHLORIDE 0.9 % IR SOLN
Status: DC | PRN
  Administered 2024-03-18: 1

## 2024-03-18 MED ORDER — DEXMEDETOMIDINE HCL IN NACL 80 MCG/20ML IV SOLN
INTRAVENOUS | Status: DC | PRN
  Administered 2024-03-18: 10 ug via INTRAVENOUS

## 2024-03-18 MED ORDER — PHENYLEPHRINE 80 MCG/ML (10ML) SYRINGE FOR IV PUSH (FOR BLOOD PRESSURE SUPPORT)
PREFILLED_SYRINGE | INTRAVENOUS | Status: AC
  Filled 2024-03-18: qty 10

## 2024-03-18 MED ORDER — PROPOFOL 10 MG/ML IV BOLUS
INTRAVENOUS | Status: DC | PRN
  Administered 2024-03-18: 150 mg via INTRAVENOUS

## 2024-03-18 MED ORDER — LIDOCAINE 2% (20 MG/ML) 5 ML SYRINGE
INTRAMUSCULAR | Status: AC
  Filled 2024-03-18: qty 5

## 2024-03-18 MED ORDER — ORAL CARE MOUTH RINSE: 15.0000 mL | Freq: Once | OROMUCOSAL | Status: AC

## 2024-03-18 MED ORDER — LIDOCAINE 2% (20 MG/ML) 5 ML SYRINGE
INTRAMUSCULAR | Status: DC | PRN
  Administered 2024-03-18: 100 mg via INTRAVENOUS

## 2024-03-18 MED ORDER — DROPERIDOL 2.5 MG/ML IJ SOLN: 0.6250 mg | Freq: Once | INTRAMUSCULAR | Status: DC | PRN

## 2024-03-18 MED ORDER — OXYCODONE HCL 5 MG/5ML PO SOLN
ORAL | Status: AC
  Filled 2024-03-18: qty 5

## 2024-03-18 MED ORDER — OXYCODONE HCL 5 MG PO TABS: 5.0000 mg | ORAL_TABLET | Freq: Once | ORAL | Status: AC | PRN

## 2024-03-18 MED ORDER — ROCURONIUM BROMIDE 10 MG/ML (PF) SYRINGE
PREFILLED_SYRINGE | INTRAVENOUS | Status: AC
  Filled 2024-03-18: qty 10

## 2024-03-18 MED ORDER — FENTANYL CITRATE (PF) 250 MCG/5ML IJ SOLN
INTRAMUSCULAR | Status: AC
  Filled 2024-03-18: qty 5

## 2024-03-18 MED ORDER — ACETAMINOPHEN 500 MG PO TABS
1000.0000 mg | ORAL_TABLET | Freq: Once | ORAL | Status: AC
  Administered 2024-03-18: 1000 mg via ORAL
  Filled 2024-03-18: qty 2

## 2024-03-18 MED ORDER — KETOROLAC TROMETHAMINE 30 MG/ML IJ SOLN
INTRAMUSCULAR | Status: AC
  Filled 2024-03-18: qty 1

## 2024-03-18 MED ORDER — CHLORHEXIDINE GLUCONATE 0.12 % MT SOLN
15.0000 mL | Freq: Once | OROMUCOSAL | Status: AC
  Administered 2024-03-18: 15 mL via OROMUCOSAL

## 2024-03-18 MED ORDER — PROPOFOL 10 MG/ML IV BOLUS
INTRAVENOUS | Status: AC
  Filled 2024-03-18: qty 20

## 2024-03-18 MED ORDER — BUPIVACAINE-EPINEPHRINE (PF) 0.25% -1:200000 IJ SOLN
INTRAMUSCULAR | Status: DC | PRN
  Administered 2024-03-18: 30 mL

## 2024-03-18 MED ORDER — FENTANYL CITRATE (PF) 100 MCG/2ML IJ SOLN: 25.0000 ug | INTRAMUSCULAR | Status: DC | PRN

## 2024-03-18 MED ORDER — FENTANYL CITRATE (PF) 250 MCG/5ML IJ SOLN
INTRAMUSCULAR | Status: DC | PRN
  Administered 2024-03-18: 100 ug via INTRAVENOUS
  Administered 2024-03-18: 50 ug via INTRAVENOUS
  Administered 2024-03-18: 100 ug via INTRAVENOUS

## 2024-03-18 MED ORDER — CEFAZOLIN SODIUM-DEXTROSE 2-4 GM/100ML-% IV SOLN
2.0000 g | Freq: Once | INTRAVENOUS | Status: AC
  Administered 2024-03-18: 2 g via INTRAVENOUS
  Filled 2024-03-18: qty 100

## 2024-03-18 MED ORDER — ONDANSETRON HCL 4 MG/2ML IJ SOLN
INTRAMUSCULAR | Status: DC | PRN
  Administered 2024-03-18: 4 mg via INTRAVENOUS

## 2024-03-18 MED ORDER — ONDANSETRON HCL 4 MG/2ML IJ SOLN
INTRAMUSCULAR | Status: AC
  Filled 2024-03-18: qty 2

## 2024-03-18 MED ORDER — DEXAMETHASONE SOD PHOSPHATE PF 10 MG/ML IJ SOLN
INTRAMUSCULAR | Status: DC | PRN
  Administered 2024-03-18: 10 mg via INTRAVENOUS

## 2024-03-18 MED ORDER — BUPIVACAINE-EPINEPHRINE (PF) 0.25% -1:200000 IJ SOLN
INTRAMUSCULAR | Status: AC
  Filled 2024-03-18: qty 30

## 2024-03-18 MED ORDER — SUGAMMADEX SODIUM 200 MG/2ML IV SOLN
INTRAVENOUS | Status: AC
  Filled 2024-03-18: qty 2

## 2024-03-18 MED ORDER — OXYCODONE-ACETAMINOPHEN 5-325 MG PO TABS: 1.0000 | ORAL_TABLET | ORAL | 0 refills | Status: AC | PRN

## 2024-03-18 MED ORDER — SUGAMMADEX SODIUM 200 MG/2ML IV SOLN
INTRAVENOUS | Status: DC | PRN
  Administered 2024-03-18: 200 mg via INTRAVENOUS

## 2024-03-18 MED ORDER — SCOPOLAMINE 1 MG/3DAYS TD PT72
MEDICATED_PATCH | TRANSDERMAL | Status: AC
  Filled 2024-03-18: qty 1

## 2024-03-18 MED ORDER — OXYCODONE HCL 5 MG/5ML PO SOLN
5.0000 mg | Freq: Once | ORAL | Status: AC | PRN
  Administered 2024-03-18: 5 mg via ORAL

## 2024-03-18 MED ORDER — SCOPOLAMINE 1 MG/3DAYS TD PT72
MEDICATED_PATCH | TRANSDERMAL | Status: DC | PRN
  Administered 2024-03-18: 1 via TRANSDERMAL

## 2024-03-18 MED ORDER — LACTATED RINGERS IV SOLN: INTRAVENOUS | Status: DC

## 2024-03-18 MED ORDER — MIDAZOLAM HCL (PF) 2 MG/2ML IJ SOLN
INTRAMUSCULAR | Status: DC | PRN
  Administered 2024-03-18: 2 mg via INTRAVENOUS

## 2024-03-18 SURGICAL SUPPLY — 32 items
CANISTER SUCTION 3000ML PPV (SUCTIONS) ×1 IMPLANT
CHLORAPREP W/TINT 26 (MISCELLANEOUS) ×1 IMPLANT
CLIP APPLIE ROT 10 11.4 M/L (STAPLE) ×1 IMPLANT
COVER SURGICAL LIGHT HANDLE (MISCELLANEOUS) ×1 IMPLANT
DERMABOND ADVANCED .7 DNX12 (GAUZE/BANDAGES/DRESSINGS) ×1 IMPLANT
ELECTRODE REM PT RTRN 9FT ADLT (ELECTROSURGICAL) ×1 IMPLANT
ENDOLOOP SUT PDS II 0 18 (SUTURE) IMPLANT
GLOVE BIO SURGEON STRL SZ7.5 (GLOVE) ×1 IMPLANT
GLOVE BIOGEL PI IND STRL 8 (GLOVE) ×1 IMPLANT
GOWN STRL REUS W/ TWL LRG LVL3 (GOWN DISPOSABLE) ×2 IMPLANT
GOWN STRL REUS W/ TWL XL LVL3 (GOWN DISPOSABLE) ×1 IMPLANT
GRASPER SUT TROCAR 14GX15 (MISCELLANEOUS) ×1 IMPLANT
IRRIGATION SUCT STRKRFLW 2 WTP (MISCELLANEOUS) ×1 IMPLANT
KIT BASIN OR (CUSTOM PROCEDURE TRAY) ×1 IMPLANT
KIT IMAGING PINPOINTPAQ (MISCELLANEOUS) IMPLANT
KIT TURNOVER KIT B (KITS) ×1 IMPLANT
NEEDLE 22X1.5 STRL (OR ONLY) (MISCELLANEOUS) ×1 IMPLANT
NEEDLE INSUFFLATION 14GA 120MM (NEEDLE) ×1 IMPLANT
PAD ARMBOARD POSITIONER FOAM (MISCELLANEOUS) ×1 IMPLANT
POUCH RETRIEVAL ECOSAC 10 (ENDOMECHANICALS) ×1 IMPLANT
SCISSORS LAP 5X35 DISP (ENDOMECHANICALS) ×1 IMPLANT
SET TUBE SMOKE EVAC HIGH FLOW (TUBING) ×1 IMPLANT
SLEEVE Z-THREAD 5X100MM (TROCAR) ×2 IMPLANT
SOLN 0.9% NACL POUR BTL 1000ML (IV SOLUTION) ×1 IMPLANT
SOLN STERILE WATER BTL 1000 ML (IV SOLUTION) ×1 IMPLANT
SUT MNCRL AB 4-0 PS2 18 (SUTURE) ×1 IMPLANT
SUT VICRYL 0 UR6 27IN ABS (SUTURE) IMPLANT
TOWEL GREEN STERILE FF (TOWEL DISPOSABLE) ×1 IMPLANT
TRAY LAPAROSCOPIC MC (CUSTOM PROCEDURE TRAY) ×1 IMPLANT
TROCAR 11X100 Z THREAD (TROCAR) ×1 IMPLANT
TROCAR Z-THREAD OPTICAL 5X100M (TROCAR) ×1 IMPLANT
WARMER LAPAROSCOPE (MISCELLANEOUS) ×1 IMPLANT

## 2024-03-18 NOTE — H&P (Signed)
 "  Admitting Physician: Deward PARAS Brittay Mogle  Service: General Surgery  CC: Biliary dyskinesia/cholecystitis  Subjective   HPI: George Peterson is an 38 y.o. male who is here for cholecystectomy  Past Medical History:  Diagnosis Date   Allergy    GERD (gastroesophageal reflux disease)    History of kidney stones    multiple stones passed as child, had lithotripsy once   Psoriasis     Past Surgical History:  Procedure Laterality Date   EXTRACORPOREAL SHOCK WAVE LITHOTRIPSY Left 12/12/2019   Procedure: LEFT EXTRACORPOREAL SHOCK WAVE LITHOTRIPSY (ESWL);  Surgeon: Rosalind Zachary NOVAK, MD;  Location: [facility];  Service: Urology;  Laterality: Left;   LITHOTRIPSY  2007   lymph node biospy     with splenic infection   PILONIDAL CYST EXCISION  2008   TRANSESOPHAGEAL ECHOCARDIOGRAM (CATH LAB) N/A 06/29/2023   Procedure: TRANSESOPHAGEAL ECHOCARDIOGRAM;  Surgeon: Lonni Slain, MD;  Location: [facility] INVASIVE CV LAB;  Service: Cardiovascular;  Laterality: N/A;   UPPER GI ENDOSCOPY     x several   WISDOM TOOTH EXTRACTION      Family History  Problem Relation Age of Onset   Hypertension Mother    Hyperlipidemia Mother    Colon polyps Mother    Skin cancer Mother    Arthritis Father    Kidney Stones Father    ALS Father    AAA (abdominal aortic aneurysm) Maternal Grandfather    Heart disease Paternal Grandfather    Colon cancer Neg Hx    Esophageal cancer Neg Hx    Rectal cancer Neg Hx    Stomach cancer Neg Hx     Social:  reports that he has never smoked. He has never used smokeless tobacco. He reports current alcohol use. He reports that he does not use drugs.  Allergies: Allergies[1]  Medications: Current Outpatient Medications  Medication Instructions   acetaminophen  (TYLENOL ) 500 mg, Oral, Every 6 hours PRN   cetirizine (ZYRTEC) 10 mg, Oral, Daily PRN   finasteride (PROPECIA) 1 mg, Oral, Daily   fluticasone (FLONASE) 50 MCG/ACT nasal spray 1  spray, Each Nare, Daily PRN   Multiple Vitamin (MULTIVITAMIN WITH MINERALS) TABS tablet 1 tablet, Oral, Daily   omeprazole  (PRILOSEC) 20 mg, Oral, 2 times daily   Skyrizi Pen 150 mg, Subcutaneous, See admin instructions, Every 12 weeks   Tapinarof 1 % CREA 1 Application, Daily PRN    ROS - all of the below systems have been reviewed with the patient and positives are indicated with bold text General: chills, fever or night sweats Eyes: blurry vision or double vision ENT: epistaxis or sore throat Allergy/Immunology: itchy/watery eyes or nasal congestion Hematologic/Lymphatic: bleeding problems, blood clots or swollen lymph nodes Endocrine: temperature intolerance or unexpected weight changes Breast: new or changing breast lumps or nipple discharge Resp: cough, shortness of breath, or wheezing CV: chest pain or dyspnea on exertion GI: as per HPI GU: dysuria, trouble voiding, or hematuria MSK: joint pain or joint stiffness Neuro: TIA or stroke symptoms Derm: pruritus and skin lesion changes Psych: anxiety and depression  Objective   PE Blood pressure (!) 134/95, pulse 91, temperature 97.8 F (36.6 C), temperature source Oral, resp. rate 20, height 5' 8 (1.727 m), weight 69.9 kg, SpO2 97%. Constitutional: NAD; conversant; no deformities Eyes: Moist conjunctiva; no lid lag; anicteric; PERRL Neck: Trachea midline; no thyromegaly Lungs: Normal respiratory effort; no tactile fremitus CV: RRR; no palpable thrills; no pitting edema GI: Abd Soft, nontender; no palpable hepatosplenomegaly MSK:  Normal range of motion of extremities; no clubbing/cyanosis Psychiatric: Appropriate affect; alert and oriented x3 Lymphatic: No palpable cervical or axillary lymphadenopathy  No results found for this or any previous visit (from the past 24 hours).  Imaging Orders  No imaging studies ordered today     Assessment and Plan   Coren Sagan is an 38 y.o. male with cholecystitis/biliary  dyskinesia.  I recommended laparoscopic cholecystectomy. We discussed the procedure, its risks, benefits and alternatives and the patient granted consent to proceed.  Deward JINNY Foy, MD  [facility] Use [email or web address] to contact on call provider       [1] No Known Allergies  "

## 2024-03-18 NOTE — Anesthesia Procedure Notes (Signed)
 Procedure Name: Intubation Date/Time: 03/18/2024 7:32 AM  Performed by: Chaney Ozell CROME, CRNAPre-anesthesia Checklist: Patient identified, Emergency Drugs available, Suction available and Patient being monitored Patient Re-evaluated:Patient Re-evaluated prior to induction Oxygen Delivery Method: Circle System Utilized Preoxygenation: Pre-oxygenation with 100% oxygen Induction Type: IV induction Ventilation: Mask ventilation without difficulty Laryngoscope Size: Mac and 4 Grade View: Grade I Tube type: Oral Tube size: 7.5 mm Number of attempts: 1 Airway Equipment and Method: Stylet and Oral airway Placement Confirmation: ETT inserted through vocal cords under direct vision, positive ETCO2 and breath sounds checked- equal and bilateral Secured at: 22 cm Tube secured with: Tape Dental Injury: Teeth and Oropharynx as per pre-operative assessment

## 2024-03-18 NOTE — Transfer of Care (Signed)
 Immediate Anesthesia Transfer of Care Note  Patient: George Peterson  Procedure(s) Performed: LAPAROSCOPIC CHOLECYSTECTOMY  Patient Location: PACU  Anesthesia Type:General  Level of Consciousness: awake, alert , oriented, and patient cooperative  Airway & Oxygen Therapy: Patient Spontanous Breathing and Patient connected to face mask oxygen  Post-op Assessment: Report given to RN, Post -op Vital signs reviewed and stable, and Patient moving all extremities X 4  Post vital signs: Reviewed and stable  Last Vitals:  Vitals Value Taken Time  BP 140/85 03/18/24 08:31  Temp    Pulse 71 03/18/24 08:39  Resp 16 03/18/24 08:39  SpO2 100 % 03/18/24 08:39  Vitals shown include unfiled device data.  Last Pain:  Vitals:   03/18/24 0640  TempSrc:   PainSc: 2       Patients Stated Pain Goal: 2 (03/18/24 0640)  Complications: No notable events documented.

## 2024-03-18 NOTE — Anesthesia Postprocedure Evaluation (Signed)
"   Anesthesia Post Note  Patient: George Peterson  Procedure(s) Performed: LAPAROSCOPIC CHOLECYSTECTOMY     Patient location during evaluation: PACU Anesthesia Type: General Level of consciousness: awake and alert Pain management: pain level controlled Vital Signs Assessment: post-procedure vital signs reviewed and stable Respiratory status: spontaneous breathing, nonlabored ventilation and respiratory function stable Cardiovascular status: blood pressure returned to baseline Postop Assessment: no apparent nausea or vomiting Anesthetic complications: no   There were no known notable events for this encounter.  Last Vitals:  Vitals:   03/18/24 0900 03/18/24 0915  BP: (!) 135/95 137/85  Pulse: 86 78  Resp: 10 16  Temp:  36.4 C  SpO2: 95% 93%    Last Pain:  Vitals:   03/18/24 0915  TempSrc:   PainSc: 3                  Vertell Row      "

## 2024-03-18 NOTE — Op Note (Signed)
 "  Patient: George Peterson (1985/12/17, [id number])  Date of Surgery: 03/18/2024  Preoperative Diagnosis: BILIARY DYSKINESIA   Postoperative Diagnosis: BILIARY DYSKINESIA   Surgical Procedure: LAPAROSCOPIC CHOLECYSTECTOMY: 52437 (CPT)    Operative Team Members:  Surgeons and Role:    * George Peterson, George PARAS, MD - Primary   Anesthesiologist: George Ragsdale Lamarr BRAVO, MD CRNA: McMillen, Michael L, CRNA   Anesthesia: General   Fluids:  Total I/O In: 800 [I.V.:700; IV Piggyback:100] Out: -   Complications: * No complications entered in OR log *  Drains:  none   Specimen:  ID Type Source Tests Collected by Time Destination  1 : Gallbladder Tissue PATH Gallbladder SURGICAL PATHOLOGY George Peterson, George PARAS, MD 03/18/2024 0800      Disposition:  PACU - hemodynamically stable.  Plan of Care: Discharge to home after PACU    Indications for Procedure: George Peterson is a 38 y.o. male who presented with abdominal pain.  History, physical and imaging was concerning for cholecystitis or biliary dyskinesia.  Laparoscopic cholecystectomy was recommended for the patient.  The procedure itself, as well as the risks, benefits and alternatives were discussed with the patient.  Risks discussed included but were not limited to the risk of infection, bleeding, damage to nearby structures, need to convert to open procedure, incisional hernia, bile leak, common bile duct injury and the need for additional procedures or surgeries.  With this discussion complete and all questions answered the patient granted consent to proceed.  Findings: Inflamed gallbladder  Infection status: Patient: Private Patient Elective Case Case: Elective Infection Present At Time Of Surgery (PATOS): Inflamed gallbladder   Description of Procedure:   On the date stated above, the patient was taken to the operating room suite and placed in supine positioning.  Sequential compression devices were placed on the lower extremities  to prevent blood clots.  General endotracheal anesthesia was induced. Preoperative antibiotics were given.  The patient's abdomen was prepped and draped in the usual sterile fashion.  A time-out was completed verifying the correct patient, procedure, positioning and equipment needed for the case.  We began by anesthetizing the skin with local anesthetic and then making a 5 mm incision just below the umbilicus.  We dissected through the subcutaneous tissues to the fascia.  The fascia was grasped and elevated using a Kocher clamp.  A Veress needle was inserted into the abdomen and the abdomen was insufflated to 15 mmHg.  A 5 mm trocar was inserted in this position under optical guidance and then the abdomen was inspected.  There was no trauma to the underlying viscera with initial trocar placement.  Any abnormal findings, other than inflammation in the right upper quadrant, are listed above in the findings section.  Three additional trocars were placed, one 12 mm trocar in the subxiphoid position, one 5 mm trocar in the midline epigastric area and one 5mm trocar in the right upper quadrant subcostally.  These were placed under direct vision without any trauma to the underlying viscera.    The patient was then placed in head up, left side down positioning.  The gallbladder was identified and dissected free from its attachments to the omentum allowing the duodenum to fall away.  The infundibulum of the gallbladder was dissected free working laterally to medially.  The cystic duct and cystic artery were dissected free from surrounding connective tissue.  The infundibulum of the gallbladder was dissected off the cystic plate.  A critical view of safety was obtained with the cystic duct and  cystic artery being cleared of connective tissues and clearly the only two structures entering into the gallbladder with the liver clearly visible behind.  Clips were then applied to the cystic duct and cystic artery and then these  structures were divided. The gallbladder was dissected off the cystic plate, placed in an endocatch bag and removed from the 12 mm subxiphoid port site.  The clips were inspected and appeared effective.  The cystic plate was inspected and hemostasis was obtained using electrocautery.  There was some spillage of bile during the case.  A suction irrigator was used to clean the operative field.  Attention was turned to closure.  The 12 mm subxiphoid port site was closed using a 0-vicryl suture on a fascial suture passer.  The abdomen was desufflated.  The skin was closed using 4-0 monocryl and dermabond.  All sponge and needle counts were correct at the conclusion of the case.    George Foy, MD General, Bariatric, & Minimally Invasive Surgery [facility], [place]  "

## 2024-03-18 NOTE — Discharge Instructions (Signed)
 CHOLECYSTECTOMY POST OPERATIVE INSTRUCTIONS  Thinking Clearly  The anesthesia may cause you to feel different for 1 or 2 days. Do not drive, drink alcohol, or make any big decisions for at least 2 days.  Nutrition When you wake up, you will be able to drink small amounts of liquid. If you do not feel sick, you can slowly advance your diet to regular foods. Continue to drink lots of fluids, usually about 8 to 10 glasses per day. Eat a high-fiber diet so you don't strain during bowel movements. High-Fiber Foods Foods high in fiber include beans, bran cereals and whole-grain breads, peas, dried fruit (figs, apricots, and dates), raspberries, blackberries, strawberries, sweet corn, broccoli, baked potatoes with skin, plums, pears, apples, greens, and nuts. Activity Slowly increase your activity. Be sure to get up and walk every hour or so to prevent blood clots. No heavy lifting or strenuous activity for 4 weeks following surgery to prevent hernias at your incision sites It is normal to feel tired. You may need more sleep than usual.  Get your rest but make sure to get up and move around frequently to prevent blood clots and pneumonia.  Work and Return to [employer] can go back to work when you feel well enough. Discuss the timing with your surgeon. You can usually go back to school or work 1 week after an operation. If your work requires heavy lifting or strenuous activity you need to be placed on light duty for 4 weeks following surgery. You can return to gym class, sports or other physical activities 4 weeks after surgery.  Wound Care Always wash your hands before and after touching near your incision site. Do not soak in a bathtub until cleared at your follow up appointment. You may take a shower 24 hours after surgery. A small amount of drainage from the incision is normal. If the drainage is thick and yellow or the site is red, you may have an infection, so call your surgeon. If you  have a drain in one of your incisions, it will be taken out in office when the drainage stops. Steri-Strips will fall off in 7 to 10 days or they will be removed during your first office visit. If you have dermabond glue covering over the incision, allow the glue to flake off on its own. Avoid wearing tight or rough clothing. It may rub your incisions and make it harder for them to heal. Protect the new skin, especially from the sun. The sun can burn and cause darker scarring. Your scar will heal in about 4 to 6 weeks and will become softer and continue to fade over the next year.  The cosmetic appearance of the incisions will improve over the course of the first year after surgery. Sensation around your incision will return in a few weeks or months.  Bowel Movements After intestinal surgery, you may have loose watery stools for several days. If watery diarrhea lasts longer than 3 days, contact your surgeon. Pain medication (narcotics) can cause constipation. Increase the fiber in your diet with high-fiber foods if you are constipated. You can take an over the counter stool softener like Colace to avoid constipation.  Additional over the counter medications can also be used if Colace isn't sufficient (for example, Milk of Magnesia or Miralax).  Pain The amount of pain is different for each person. Some people need only 1 to 3 doses of pain control medication, while others need more. Take alternating doses of tylenol   and ibuprofen around the clock for the first five days following surgery.  This will provide a baseline of pain control and help with inflammation.  Take the narcotic pain medication in addition if needed for severe pain.  Contact Your Surgeon at [phone], if you have: Pain in your right upper abdomen like a gallbladder attack. Pain that will not go away Pain that gets worse A fever of more than 101F (38.3C) Repeated vomiting Swelling, redness, bleeding, or bad-smelling  drainage from your wound site Strong abdominal pain No bowel movement or unable to pass gas for 3 days Watery diarrhea lasting longer than 3 days  Pain Control The goal of pain control is to minimize pain, keep you moving and help you heal. Your surgical team will work with you on your pain plan. Most often a combination of therapies and medications are used to control your pain. You may also be given medication (local anesthetic) at the surgical site. This may help control your pain for several days. Extreme pain puts extra stress on your body at a time when your body needs to focus on healing. Do not wait until your pain has reached a level "10" or is unbearable before telling your doctor or nurse. It is much easier to control pain before it becomes severe. Following a laparoscopic procedure, pain is sometimes felt in the shoulder. This is due to the gas inserted into your abdomen during the procedure. Moving and walking helps to decrease the gas and the right shoulder pain.  Use the guide below for ways to manage your post-operative pain. Learn more by going to [email or web address].[email or web address]/safepaincontrol.  How Intense Is My Pain Common Therapies to Feel Better       I hardly notice my pain, and it does not interfere with my activities.  I notice my pain and it distracts me, but I can still do activities (sitting up, walking, standing).  Non-Medication Therapies  Ice (in a bag, applied over clothing at the surgical site), elevation, rest, meditation, massage, distraction (music, TV, play) walking and mild exercise Splinting the abdomen with pillows +  Non-Opioid Medications Acetaminophen  (Tylenol ) Non-steroidal anti-inflammatory drugs (NSAIDS) Aspirin, Ibuprofen (Motrin, Advil) Naproxen (Aleve) Take these as needed, when you feel pain. Both acetaminophen  and NSAIDs help to decrease pain and swelling (inflammation).      My pain is hard to ignore and is more noticeable even when I rest.  My  pain interferes with my usual activities.  Non-Medication Therapies  +  Non-Opioid medications  Take on a regular schedule (around-the-clock) instead of as needed. (For example, Tylenol  every 6 hours at 9:00 am, 3:00 pm, 9:00 pm, 3:00 am and Motrin every 6 hours at 12:00 am, 6:00 am, 12:00 pm, 6:00 pm)         I am focused on my pain, and I am not doing my daily activities.  I am groaning in pain, and I cannot sleep. I am unable to do anything.  My pain is as bad as it could be, and nothing else matters.  Non-Medication Therapies  +  Around-the-Clock Non-Opioid Medications  +  Short-acting opioids  Opioids should be used with other medications to manage severe pain. Opioids block pain and give a feeling of euphoria (feel high). Addiction, a serious side effect of opioids, is rare with short-term (a few days) use.  Examples of short-acting opioids include: Tramadol  (Ultram ), Hydrocodone  (Norco, Vicodin), Hydromorphone  (Dilaudid ), Oxycodone  (Oxycontin )     The above directions have been adapted from  the [employer] Surgical Patient Education Program.  Please refer to the ACS website if needed: [email or web address].   Deward Foy, MD [facility] [address], [place], [place]  [address] ?  [address] [address], [place], [place]   [address] [phone] ? [phone] ? FAX [phone] Web site: [email or web address]

## 2024-03-19 ENCOUNTER — Encounter (HOSPITAL_COMMUNITY): Payer: Self-pay | Admitting: Surgery

## 2024-03-19 LAB — SURGICAL PATHOLOGY

## 2024-04-15 ENCOUNTER — Ambulatory Visit: Admitting: Cardiology

## 2024-05-31 ENCOUNTER — Ambulatory Visit: Admitting: Cardiology
